# Patient Record
Sex: Female | Born: 1970 | Race: White | Hispanic: No | Smoking: Never smoker
Health system: Southern US, Community
[De-identification: ages and names within clinical notes are randomized; demographics above are authoritative.]

## PROBLEM LIST (undated history)

## (undated) DIAGNOSIS — B009 Herpesviral infection, unspecified: Secondary | ICD-10-CM

## (undated) DIAGNOSIS — N200 Calculus of kidney: Secondary | ICD-10-CM

## (undated) DIAGNOSIS — Z8759 Personal history of other complications of pregnancy, childbirth and the puerperium: Secondary | ICD-10-CM

## (undated) DIAGNOSIS — F329 Major depressive disorder, single episode, unspecified: Secondary | ICD-10-CM

## (undated) DIAGNOSIS — R51 Headache: Secondary | ICD-10-CM

## (undated) DIAGNOSIS — F32A Depression, unspecified: Secondary | ICD-10-CM

## (undated) DIAGNOSIS — F419 Anxiety disorder, unspecified: Secondary | ICD-10-CM

## (undated) DIAGNOSIS — I10 Essential (primary) hypertension: Secondary | ICD-10-CM

## (undated) HISTORY — PX: DILATION AND CURETTAGE OF UTERUS: SHX78

## (undated) HISTORY — PX: INCISION AND DRAINAGE PERIRECTAL ABSCESS: SHX1804

## (undated) HISTORY — PX: TONSILLECTOMY: SUR1361

## (undated) HISTORY — PX: HERNIA REPAIR: SHX51

---

## 2006-11-29 ENCOUNTER — Encounter: Admission: RE | Admit: 2006-11-29 | Discharge: 2006-11-29 | Payer: Self-pay | Admitting: Obstetrics and Gynecology

## 2007-01-07 ENCOUNTER — Encounter: Admission: RE | Admit: 2007-01-07 | Discharge: 2007-01-07 | Payer: Self-pay | Admitting: Specialist

## 2008-01-12 ENCOUNTER — Encounter: Admission: RE | Admit: 2008-01-12 | Discharge: 2008-01-12 | Payer: Self-pay | Admitting: Obstetrics and Gynecology

## 2008-03-16 ENCOUNTER — Encounter (HOSPITAL_COMMUNITY): Payer: Self-pay | Admitting: Obstetrics and Gynecology

## 2008-03-16 ENCOUNTER — Ambulatory Visit (HOSPITAL_COMMUNITY): Admission: RE | Admit: 2008-03-16 | Discharge: 2008-03-16 | Payer: Self-pay | Admitting: Obstetrics and Gynecology

## 2009-02-03 ENCOUNTER — Encounter: Admission: RE | Admit: 2009-02-03 | Discharge: 2009-02-03 | Payer: Self-pay | Admitting: Obstetrics and Gynecology

## 2010-02-13 ENCOUNTER — Encounter: Admission: RE | Admit: 2010-02-13 | Discharge: 2010-02-13 | Payer: Self-pay | Admitting: Obstetrics and Gynecology

## 2010-11-21 NOTE — Op Note (Signed)
NAMERUSSIA, SCHEIDERER                 ACCOUNT NO.:  [id number]   MEDICAL RECORD NO.:  [id number]          PATIENT TYPE:  AMB   LOCATION:  SDC                           FACILITY:  [facility]   PHYSICIAN:  Zelphia Cairo, MD    DATE OF BIRTH:  22-Aug-1970   DATE OF PROCEDURE:  DATE OF DISCHARGE:                               OPERATIVE REPORT   PREOPERATIVE DIAGNOSIS:  Missed abortion.   POSTOPERATIVE DIAGNOSIS:  Missed abortion.   PROCEDURE:  Suction D and E.   SURGEON:  Zelphia Cairo, MD   ANESTHESIA:  General and local.   SPECIMEN:  Products of conception.   DISPOSITION:  Pathology.   ESTIMATED BLOOD LOSS:  300 mL.   COMPLICATIONS:  None.   CONDITION:  Stable to recovery room.   PROCEDURE:  The patient was taken to the operating room where general  anesthesia was found to be adequate.  She was placed in the dorsal  lithotomy position using Allen stirrups.  She was prepped and draped in  sterile fashion, and an in-and-out catheter was used to drain her  bladder for approximately 30 mL of clear urine.  Bivalve speculum was  placed in the vagina, and a single-tooth tenaculum was placed on the  anterior lip of the cervix.  The uterus was serially dilated using Pratt  dilators.  An 8-French suction catheter was inserted into the uterine  cavity and products of conception were removed.  A gentle curetting was  then performed in a clockwise fashion.  There was noted to be some more  tissue within the uterine cavity.  Therefore, the suction curette was  reinserted.  Suction curette was removed.  Another curetting was  performed.  An uterine cry was noted.  Tenaculum was removed from the  cervix.  The cervix was inspected and found to be hemostatic.  Uterus  was firm and hemostatic.  Speculum was removed, and the patient was  taken to the recovery room in stable condition.      Zelphia Cairo, MD  Electronically Signed     GA/MEDQ  D:  03/16/2008  T:  03/16/2008  Job:  [id number]

## 2011-04-11 LAB — CBC
Hemoglobin: 11.4 — ABNORMAL LOW
MCHC: 33.3
MCV: 81
RDW: 14.8
WBC: 5.1

## 2011-05-08 ENCOUNTER — Other Ambulatory Visit: Payer: Self-pay | Admitting: Obstetrics and Gynecology

## 2011-05-08 DIAGNOSIS — Z1231 Encounter for screening mammogram for malignant neoplasm of breast: Secondary | ICD-10-CM

## 2011-05-28 ENCOUNTER — Ambulatory Visit
Admission: RE | Admit: 2011-05-28 | Discharge: 2011-05-28 | Disposition: A | Discharge status: Home / Self Care | Payer: BC Managed Care – PPO | Source: Ambulatory Visit | Attending: Obstetrics and Gynecology | Admitting: Obstetrics and Gynecology

## 2011-05-28 DIAGNOSIS — Z1231 Encounter for screening mammogram for malignant neoplasm of breast: Secondary | ICD-10-CM

## 2012-04-25 ENCOUNTER — Other Ambulatory Visit: Payer: Self-pay | Admitting: Obstetrics and Gynecology

## 2012-04-25 DIAGNOSIS — Z1231 Encounter for screening mammogram for malignant neoplasm of breast: Secondary | ICD-10-CM

## 2012-05-30 ENCOUNTER — Ambulatory Visit: Payer: BC Managed Care – PPO

## 2012-06-25 LAB — OB RESULTS CONSOLE RUBELLA ANTIBODY, IGM: Rubella: IMMUNE

## 2012-06-25 LAB — OB RESULTS CONSOLE ABO/RH

## 2012-06-25 LAB — OB RESULTS CONSOLE GC/CHLAMYDIA
Chlamydia: NEGATIVE
Gonorrhea: NEGATIVE

## 2012-06-25 LAB — OB RESULTS CONSOLE HIV ANTIBODY (ROUTINE TESTING): HIV: NONREACTIVE

## 2012-06-25 LAB — OB RESULTS CONSOLE RPR: RPR: NONREACTIVE

## 2012-06-26 ENCOUNTER — Ambulatory Visit: Payer: BC Managed Care – PPO

## 2012-07-09 NOTE — L&D Delivery Note (Signed)
Delivery Note At 7:15 AM a viable female was delivered via Vaginal, Spontaneous Delivery (Presentation: ; Occiput Anterior).  APGAR: , ; weight .   Placenta status: , .  Cord:  with the following complications: None.  Cord pH: sent  Anesthesia: Epidural  Episiotomy: none Lacerations: sed deg + bilat periurethral lac Suture Repair: 3.0 vicryl rapide Est. Blood Loss (mL): 400  Mom to postpartum.  Baby to nursery-stable.  [name] 01/20/2013, 7:43 AM

## 2012-12-31 LAB — OB RESULTS CONSOLE GBS: GBS: NEGATIVE

## 2013-01-09 ENCOUNTER — Encounter (HOSPITAL_COMMUNITY): Payer: Self-pay | Admitting: *Deleted

## 2013-01-09 ENCOUNTER — Inpatient Hospital Stay (HOSPITAL_COMMUNITY)
Admission: AD | Admit: 2013-01-09 | Discharge: 2013-01-09 | Disposition: A | Discharge status: Home / Self Care | Payer: BC Managed Care – PPO | Source: Ambulatory Visit | Attending: Obstetrics and Gynecology | Admitting: Obstetrics and Gynecology

## 2013-01-09 DIAGNOSIS — O10019 Pre-existing essential hypertension complicating pregnancy, unspecified trimester: Secondary | ICD-10-CM | POA: Insufficient documentation

## 2013-01-09 DIAGNOSIS — D696 Thrombocytopenia, unspecified: Secondary | ICD-10-CM | POA: Insufficient documentation

## 2013-01-09 DIAGNOSIS — O99119 Other diseases of the blood and blood-forming organs and certain disorders involving the immune mechanism complicating pregnancy, unspecified trimester: Secondary | ICD-10-CM | POA: Insufficient documentation

## 2013-01-09 DIAGNOSIS — D689 Coagulation defect, unspecified: Secondary | ICD-10-CM | POA: Insufficient documentation

## 2013-01-09 HISTORY — DX: Anxiety disorder, unspecified: F41.9

## 2013-01-09 HISTORY — DX: Depression, unspecified: F32.A

## 2013-01-09 HISTORY — DX: Essential (primary) hypertension: I10

## 2013-01-09 HISTORY — DX: Headache: R51

## 2013-01-09 HISTORY — DX: Herpesviral infection, unspecified: B00.9

## 2013-01-09 HISTORY — DX: Personal history of other complications of pregnancy, childbirth and the puerperium: Z87.59

## 2013-01-09 HISTORY — DX: Major depressive disorder, single episode, unspecified: F32.9

## 2013-01-09 HISTORY — DX: Calculus of kidney: N20.0

## 2013-01-09 LAB — COMPREHENSIVE METABOLIC PANEL
ALT: 13 U/L (ref 0–35)
AST: 14 U/L (ref 0–37)
Alkaline Phosphatase: 105 U/L (ref 39–117)
BUN: 10 mg/dL (ref 6–23)
CO2: 22 mEq/L (ref 19–32)
Calcium: 8.8 mg/dL (ref 8.4–10.5)
GFR calc non Af Amer: >90 mL/min (ref >90)
Glucose, Bld: 72 mg/dL (ref 70–99)
Sodium: 135 mEq/L (ref 135–145)

## 2013-01-09 LAB — URINALYSIS, ROUTINE W REFLEX MICROSCOPIC
Bilirubin Urine: NEGATIVE
Glucose, UA: NEGATIVE mg/dL
Ketones, ur: NEGATIVE mg/dL
Specific Gravity, Urine: 1.025 (ref 1.005–1.030)

## 2013-01-09 LAB — CBC
MCH: 27.1 pg (ref 26.0–34.0)
RBC: 3.95 MIL/uL (ref 3.87–5.11)
WBC: 6.4 10*3/uL (ref 4.0–10.5)

## 2013-01-09 NOTE — MAU Note (Signed)
Patient has chronic HTN, now being assessed for PIH. States was told Plts were low and was called to come in today for re-check of labs.

## 2013-01-19 ENCOUNTER — Encounter (HOSPITAL_COMMUNITY): Payer: Self-pay | Admitting: *Deleted

## 2013-01-19 ENCOUNTER — Inpatient Hospital Stay (HOSPITAL_COMMUNITY)
Admission: AD | Admit: 2013-01-19 | Discharge: 2013-01-23 | DRG: 372 | Disposition: A | Discharge status: Home / Self Care | Payer: BC Managed Care – PPO | Source: Ambulatory Visit | Attending: Obstetrics and Gynecology | Admitting: Obstetrics and Gynecology

## 2013-01-19 DIAGNOSIS — D689 Coagulation defect, unspecified: Secondary | ICD-10-CM | POA: Diagnosis not present

## 2013-01-19 DIAGNOSIS — IMO0002 Reserved for concepts with insufficient information to code with codable children: Principal | ICD-10-CM | POA: Diagnosis present

## 2013-01-19 DIAGNOSIS — I10 Essential (primary) hypertension: Secondary | ICD-10-CM | POA: Diagnosis present

## 2013-01-19 DIAGNOSIS — O09529 Supervision of elderly multigravida, unspecified trimester: Secondary | ICD-10-CM | POA: Diagnosis present

## 2013-01-19 DIAGNOSIS — D696 Thrombocytopenia, unspecified: Secondary | ICD-10-CM | POA: Diagnosis not present

## 2013-01-19 DIAGNOSIS — D649 Anemia, unspecified: Secondary | ICD-10-CM | POA: Diagnosis not present

## 2013-01-19 DIAGNOSIS — O9903 Anemia complicating the puerperium: Secondary | ICD-10-CM | POA: Diagnosis not present

## 2013-01-19 LAB — CBC
MCV: 82.3 fL (ref 78.0–100.0)
Platelets: 113 10*3/uL — ABNORMAL LOW (ref 150–400)
RDW: 14.8 % (ref 11.5–15.5)
WBC: 6.6 10*3/uL (ref 4.0–10.5)

## 2013-01-19 LAB — COMPREHENSIVE METABOLIC PANEL
Albumin: 2.6 g/dL — ABNORMAL LOW (ref 3.5–5.2)
Alkaline Phosphatase: 121 U/L — ABNORMAL HIGH (ref 39–117)
BUN: 12 mg/dL (ref 6–23)
Calcium: 9 mg/dL (ref 8.4–10.5)
Creatinine, Ser: 0.89 mg/dL (ref 0.50–1.10)
GFR calc Af Amer: >90 mL/min (ref >90)
Glucose, Bld: 83 mg/dL (ref 70–99)
Potassium: 4.4 mEq/L (ref 3.5–5.1)
Total Protein: 5.9 g/dL — ABNORMAL LOW (ref 6.0–8.3)

## 2013-01-19 MED ORDER — ACETAMINOPHEN 325 MG PO TABS: 650.0000 mg | ORAL_TABLET | ORAL | Status: DC | PRN

## 2013-01-19 MED ORDER — TERBUTALINE SULFATE 1 MG/ML IJ SOLN: 0.2500 mg | Freq: Once | INTRAMUSCULAR | Status: AC | PRN

## 2013-01-19 MED ORDER — LIDOCAINE HCL (PF) 1 % IJ SOLN
30.0000 mL | INTRAMUSCULAR | Status: DC | PRN
  Filled 2013-01-19: qty 30

## 2013-01-19 MED ORDER — FLEET ENEMA 7-19 GM/118ML RE ENEM: 1.0000 | ENEMA | RECTAL | Status: DC | PRN

## 2013-01-19 MED ORDER — ZOLPIDEM TARTRATE 5 MG PO TABS: 5.0000 mg | ORAL_TABLET | Freq: Every evening | ORAL | Status: DC | PRN

## 2013-01-19 MED ORDER — MISOPROSTOL 25 MCG QUARTER TABLET
25.0000 ug | ORAL_TABLET | ORAL | Status: DC | PRN
  Administered 2013-01-19 – 2013-01-20 (×2): 25 ug via VAGINAL
  Filled 2013-01-19 (×2): qty 0.25
  Filled 2013-01-19: qty 1

## 2013-01-19 MED ORDER — OXYCODONE-ACETAMINOPHEN 5-325 MG PO TABS: 1.0000 | ORAL_TABLET | ORAL | Status: DC | PRN

## 2013-01-19 MED ORDER — LACTATED RINGERS IV SOLN
INTRAVENOUS | Status: DC
  Administered 2013-01-19 – 2013-01-20 (×2): via INTRAVENOUS

## 2013-01-19 MED ORDER — ONDANSETRON HCL 4 MG/2ML IJ SOLN: 4.0000 mg | Freq: Four times a day (QID) | INTRAMUSCULAR | Status: DC | PRN

## 2013-01-19 MED ORDER — OXYTOCIN 40 UNITS IN LACTATED RINGERS INFUSION - SIMPLE MED
62.5000 mL/h | INTRAVENOUS | Status: DC
  Administered 2013-01-20: 62.5 mL/h via INTRAVENOUS
  Filled 2013-01-19: qty 1000

## 2013-01-19 MED ORDER — OXYTOCIN BOLUS FROM INFUSION: 500.0000 mL | INTRAVENOUS | Status: DC

## 2013-01-19 MED ORDER — IBUPROFEN 600 MG PO TABS
600.0000 mg | ORAL_TABLET | Freq: Four times a day (QID) | ORAL | Status: DC | PRN
  Administered 2013-01-20: 600 mg via ORAL
  Filled 2013-01-19: qty 1

## 2013-01-19 MED ORDER — LACTATED RINGERS IV SOLN: 500.0000 mL | INTRAVENOUS | Status: DC | PRN

## 2013-01-19 MED ORDER — CITRIC ACID-SODIUM CITRATE 334-500 MG/5ML PO SOLN: 30.0000 mL | ORAL | Status: DC | PRN

## 2013-01-19 MED ORDER — METHYLDOPA 500 MG PO TABS
500.0000 mg | ORAL_TABLET | Freq: Once | ORAL | Status: AC
  Administered 2013-01-19: 500 mg via ORAL
  Filled 2013-01-19: qty 1

## 2013-01-19 NOTE — MAU Note (Signed)
Pt reports the doctor told her to come to the hospital at 2100, reports her B/P was elevated in office today. History of high blood pressure.

## 2013-01-19 NOTE — MAU Provider Note (Signed)
  History     CSN: [id number]  Arrival date and time: 01/19/13 2111   None     Chief Complaint  Patient presents with  . Hypertension   HPI [name] is a 42 y.o. [id number] at [redacted]w[redacted]d who was sent to MAU today from the office with elevated BP. The patient has mild headache and peripheral edema. She denies RUQ pain.   OB History   Grav Para Term Preterm Abortions TAB SAB Ect Mult Living   7 1 1  5  5   1       Past Medical History  Diagnosis Date  . Hypertension   . Headache(784.0)   . Herpes   . Anxiety   . Depression   . Kidney stones   . History of miscarriage     X 4    Past Surgical History  Procedure Laterality Date  . Incision and drainage perirectal abscess    . Hernia repair    . Dilation and curettage of uterus      Family History  Problem Relation Age of Onset  . Hypertension Mother   . Hypertension Brother     History  Substance Use Topics  . Smoking status: Never Smoker   . Smokeless tobacco: Never Used  . Alcohol Use: No    Allergies: No Known Allergies  Prescriptions prior to admission  Medication Sig Dispense Refill  . methyldopa (ALDOMET) 250 MG tablet Take 250 mg by mouth 1 day or 1 dose.      . methyldopa (ALDOMET) 500 MG tablet Take 500 mg by mouth 2 (two) times daily.      . pantoprazole (PROTONIX) 20 MG tablet Take 20 mg by mouth daily.      . Prenatal Vit-Fe Fumarate-FA (PRENATAL MULTIVITAMIN) TABS Take 1 tablet by mouth daily at 12 noon.      . valACYclovir (VALTREX) 500 MG tablet Take 500 mg by mouth 2 (two) times daily.        Review of Systems  Gastrointestinal: Negative for abdominal pain.  Musculoskeletal:       + edema   Physical Exam   Blood pressure 177/99, pulse 83, temperature 98.1 F (36.7 C), temperature source Oral, resp. rate 18, height 5\' 6"  (1.676 m), weight 283 lb (128.368 kg), SpO2 99.00%.  Physical Exam  Constitutional: She is oriented to person, place, and time. She appears well-developed and  well-nourished. No distress.  HENT:  Head: Normocephalic and atraumatic.  Cardiovascular: Normal rate, regular rhythm and normal heart sounds.   Respiratory: Effort normal and breath sounds normal. No respiratory distress.  GI: Soft. Bowel sounds are normal. She exhibits no distension and no mass. There is no tenderness. There is no rebound and no guarding.  Musculoskeletal: She exhibits edema (1+ pitting edema in the lower extremities).  Neurological: She is alert and oriented to person, place, and time. She has normal reflexes.  No clonus  Skin: Skin is warm and dry. No erythema.  Psychiatric: She has a normal mood and affect.   MAU Course  Procedures None  MDM Per Dr. Marcelle Overlie medical screening exam only.   Assessment and Plan  Medical screening exam complete Dr. Marcelle Overlie to enter patient's orders. RN will contact Dr. Marcelle Overlie for further management.   Freddi Starr, PA-C  01/19/2013, 10:05 PM

## 2013-01-19 NOTE — H&P (Signed)
Gabriela Stone  DICTATION # [id number] CSN# [id number]   [name], MD 01/19/2013 10:06 PM

## 2013-01-19 NOTE — H&P (Signed)
Gabriela Stone, Gabriela Stone                 ACCOUNT NO.:  [id number]  MEDICAL RECORD NO.:  [id number]  LOCATION:  910B                          FACILITY:  [facility]  PHYSICIAN:  Duke Salvia. Marcelle Overlie, M.D.DATE OF BIRTH:  19-May-1971  DATE OF ADMISSION: DATE OF DISCHARGE:                             HISTORY & PHYSICAL   CHIEF COMPLAINT:  For labor induction, preeclampsia, chronic hypertension.  HISTORY OF PRESENT ILLNESS:  A 42 year old, G7 P1-0-5-1, 38 weeks 1 day. Patient is scheduled for induction later this month, who presented to the office today, already on Aldomet with 1+ protein, BP 178/110 with significant lower extremity edema.  Stat PIH labs were drawn in the office and she would be admitted for two-stage labor induction.  GBS was negative.  Additionally, she has a history of HSV.  She has been on oral Valtrex once daily since 34 weeks.  The procedure for Cytotec followed by Pitocin or AROM labor induction reviewed with her.  Blood type is O positive.  Maternity-21 was done early in pregnancy returned normal.  PAST MEDICAL HISTORY:  Please see the [facility] form for details.  ALLERGIES:  None.  PHYSICAL EXAMINATION:  VITAL SIGNS:  Temp 98.2, blood pressure 178/110. HEENT:  Unremarkable. NECK:  Supple without masses. LUNGS:  Clear. CARDIOVASCULAR:  Regular rate and rhythm without murmurs, rubs, gallops. BREASTS:  Not examined. ABDOMEN:  Term fundal height.  Fetal heart rate 140.  Cervix was fingertip 50% vertex. EXTREMITIES:  Reveal 2 to 3+ pitting lower extremity edema.  Reflexes 1 to 2+.  No clonus.  IMPRESSION: 1. A 38+ week IUP. 2. Chronic hypertension on Aldomet. 3. Superimposed preeclampsia.  PLAN:  Two-stage labor induction.  Procedure and protocol for labor induction reviewed with her which she understands and accepts.     [name], M.D.     [facility]/MEDQ  D:  01/19/2013  T:  01/19/2013  Job:  [id number]

## 2013-01-19 NOTE — MAU Note (Signed)
PT WAS SEEN IN OFFICE - BP ELEVATED   VE 2 CM,  HX- HSV -  LAST OUTBREAK- CAN'T REMEMBER. -  DENIES ANY S/S  FOR OUTBREAK .  TAKING VALTREX

## 2013-01-20 ENCOUNTER — Encounter (HOSPITAL_COMMUNITY): Payer: Self-pay

## 2013-01-20 ENCOUNTER — Inpatient Hospital Stay (HOSPITAL_COMMUNITY): Payer: BC Managed Care – PPO | Admitting: Anesthesiology

## 2013-01-20 ENCOUNTER — Encounter (HOSPITAL_COMMUNITY): Payer: Self-pay | Admitting: Anesthesiology

## 2013-01-20 LAB — CBC
Hemoglobin: 11.5 g/dL — ABNORMAL LOW (ref 12.0–15.0)
MCH: 27.4 pg (ref 26.0–34.0)
MCHC: 33.5 g/dL (ref 30.0–36.0)
Platelets: 115 10*3/uL — ABNORMAL LOW (ref 150–400)
RBC: 4.19 MIL/uL (ref 3.87–5.11)

## 2013-01-20 LAB — TYPE AND SCREEN: Antibody Screen: NEGATIVE

## 2013-01-20 LAB — RPR: RPR Ser Ql: NONREACTIVE

## 2013-01-20 LAB — MRSA PCR SCREENING: MRSA by PCR: NEGATIVE

## 2013-01-20 LAB — ABO/RH: ABO/RH(D): O POS

## 2013-01-20 MED ORDER — PRENATAL MULTIVITAMIN CH
1.0000 | ORAL_TABLET | Freq: Every day | ORAL | Status: DC
  Administered 2013-01-20 – 2013-01-22 (×2): 1 via ORAL
  Filled 2013-01-20 (×2): qty 1

## 2013-01-20 MED ORDER — BUTORPHANOL TARTRATE 1 MG/ML IJ SOLN
1.0000 mg | Freq: Once | INTRAMUSCULAR | Status: DC
  Filled 2013-01-20 (×2): qty 1

## 2013-01-20 MED ORDER — SENNOSIDES-DOCUSATE SODIUM 8.6-50 MG PO TABS
2.0000 | ORAL_TABLET | Freq: Every day | ORAL | Status: DC
  Administered 2013-01-20 – 2013-01-21 (×2): 2 via ORAL

## 2013-01-20 MED ORDER — FLEET ENEMA 7-19 GM/118ML RE ENEM: 1.0000 | ENEMA | Freq: Every day | RECTAL | Status: DC | PRN

## 2013-01-20 MED ORDER — MAGNESIUM SULFATE 40 G IN LACTATED RINGERS - SIMPLE
2.0000 g/h | INTRAVENOUS | Status: DC
  Filled 2013-01-20: qty 500

## 2013-01-20 MED ORDER — MAGNESIUM SULFATE BOLUS VIA INFUSION
4.0000 g | Freq: Once | INTRAVENOUS | Status: AC
  Administered 2013-01-20: 4 g via INTRAVENOUS
  Filled 2013-01-20: qty 500

## 2013-01-20 MED ORDER — FENTANYL 2.5 MCG/ML BUPIVACAINE 1/10 % EPIDURAL INFUSION (WH - ANES)
14.0000 mL/h | INTRAMUSCULAR | Status: DC | PRN
  Administered 2013-01-20: 14 mL/h via EPIDURAL
  Filled 2013-01-20: qty 125

## 2013-01-20 MED ORDER — MEASLES, MUMPS & RUBELLA VAC ~~LOC~~ INJ
0.5000 mL | INJECTION | Freq: Once | SUBCUTANEOUS | Status: DC
  Filled 2013-01-20: qty 0.5

## 2013-01-20 MED ORDER — PROMETHAZINE HCL 25 MG/ML IJ SOLN
12.5000 mg | Freq: Once | INTRAMUSCULAR | Status: DC
  Filled 2013-01-20: qty 1

## 2013-01-20 MED ORDER — EPHEDRINE 5 MG/ML INJ
10.0000 mg | INTRAVENOUS | Status: DC | PRN
  Filled 2013-01-20: qty 4

## 2013-01-20 MED ORDER — LANOLIN HYDROUS EX OINT: TOPICAL_OINTMENT | CUTANEOUS | Status: DC | PRN

## 2013-01-20 MED ORDER — PHENYLEPHRINE 40 MCG/ML (10ML) SYRINGE FOR IV PUSH (FOR BLOOD PRESSURE SUPPORT)
80.0000 ug | PREFILLED_SYRINGE | INTRAVENOUS | Status: DC | PRN
  Filled 2013-01-20: qty 5

## 2013-01-20 MED ORDER — LABETALOL HCL 200 MG PO TABS
200.0000 mg | ORAL_TABLET | Freq: Three times a day (TID) | ORAL | Status: DC
  Administered 2013-01-20 – 2013-01-23 (×8): 200 mg via ORAL
  Filled 2013-01-20 (×9): qty 1

## 2013-01-20 MED ORDER — ZOLPIDEM TARTRATE 5 MG PO TABS: 5.0000 mg | ORAL_TABLET | Freq: Every evening | ORAL | Status: DC | PRN

## 2013-01-20 MED ORDER — LACTATED RINGERS IV SOLN
500.0000 mL | Freq: Once | INTRAVENOUS | Status: AC
  Administered 2013-01-20: 500 mL via INTRAVENOUS

## 2013-01-20 MED ORDER — BENZOCAINE-MENTHOL 20-0.5 % EX AERO
1.0000 "application " | INHALATION_SPRAY | CUTANEOUS | Status: DC | PRN
  Administered 2013-01-20 – 2013-01-21 (×2): 1 via TOPICAL
  Filled 2013-01-20 (×3): qty 56

## 2013-01-20 MED ORDER — DIPHENHYDRAMINE HCL 25 MG PO CAPS: 25.0000 mg | ORAL_CAPSULE | Freq: Four times a day (QID) | ORAL | Status: DC | PRN

## 2013-01-20 MED ORDER — ONDANSETRON HCL 4 MG PO TABS: 4.0000 mg | ORAL_TABLET | ORAL | Status: DC | PRN

## 2013-01-20 MED ORDER — DIBUCAINE 1 % RE OINT
1.0000 "application " | TOPICAL_OINTMENT | RECTAL | Status: DC | PRN
  Filled 2013-01-20: qty 28

## 2013-01-20 MED ORDER — OXYCODONE-ACETAMINOPHEN 5-325 MG PO TABS
1.0000 | ORAL_TABLET | ORAL | Status: DC | PRN
  Administered 2013-01-20: 2 via ORAL
  Administered 2013-01-21: 1 via ORAL
  Administered 2013-01-21: 2 via ORAL
  Filled 2013-01-20 (×2): qty 2
  Filled 2013-01-20: qty 1

## 2013-01-20 MED ORDER — PHENYLEPHRINE 40 MCG/ML (10ML) SYRINGE FOR IV PUSH (FOR BLOOD PRESSURE SUPPORT): 80.0000 ug | PREFILLED_SYRINGE | INTRAVENOUS | Status: DC | PRN

## 2013-01-20 MED ORDER — WITCH HAZEL-GLYCERIN EX PADS: 1.0000 "application " | MEDICATED_PAD | CUTANEOUS | Status: DC | PRN

## 2013-01-20 MED ORDER — OXYCODONE-ACETAMINOPHEN 5-325 MG PO TABS
1.0000 | ORAL_TABLET | Freq: Four times a day (QID) | ORAL | Status: DC | PRN
  Administered 2013-01-20: 1 via ORAL
  Filled 2013-01-20: qty 1

## 2013-01-20 MED ORDER — BISACODYL 10 MG RE SUPP
10.0000 mg | Freq: Every day | RECTAL | Status: DC | PRN
  Filled 2013-01-20: qty 1

## 2013-01-20 MED ORDER — ONDANSETRON HCL 4 MG/2ML IJ SOLN
4.0000 mg | INTRAMUSCULAR | Status: DC | PRN
  Administered 2013-01-20 – 2013-01-21 (×2): 4 mg via INTRAVENOUS
  Filled 2013-01-20 (×2): qty 2

## 2013-01-20 MED ORDER — IBUPROFEN 800 MG PO TABS
800.0000 mg | ORAL_TABLET | Freq: Three times a day (TID) | ORAL | Status: DC | PRN
  Administered 2013-01-20: 800 mg via ORAL
  Filled 2013-01-20: qty 1

## 2013-01-20 MED ORDER — LACTATED RINGERS IV SOLN
INTRAVENOUS | Status: DC
  Administered 2013-01-20 (×2): via INTRAVENOUS

## 2013-01-20 MED ORDER — EPHEDRINE 5 MG/ML INJ: 10.0000 mg | INTRAVENOUS | Status: DC | PRN

## 2013-01-20 MED ORDER — SIMETHICONE 80 MG PO CHEW: 80.0000 mg | CHEWABLE_TABLET | ORAL | Status: DC | PRN

## 2013-01-20 MED ORDER — PANTOPRAZOLE SODIUM 20 MG PO TBEC
20.0000 mg | DELAYED_RELEASE_TABLET | Freq: Every day | ORAL | Status: DC
  Administered 2013-01-20 – 2013-01-23 (×4): 20 mg via ORAL
  Filled 2013-01-20 (×4): qty 1

## 2013-01-20 MED ORDER — TETANUS-DIPHTH-ACELL PERTUSSIS 5-2.5-18.5 LF-MCG/0.5 IM SUSP
0.5000 mL | Freq: Once | INTRAMUSCULAR | Status: DC
  Filled 2013-01-20: qty 0.5

## 2013-01-20 MED ORDER — DIPHENHYDRAMINE HCL 50 MG/ML IJ SOLN: 12.5000 mg | INTRAMUSCULAR | Status: DC | PRN

## 2013-01-20 MED ORDER — SODIUM BICARBONATE 8.4 % IV SOLN
INTRAVENOUS | Status: DC | PRN
  Administered 2013-01-20: 5 mL via EPIDURAL

## 2013-01-20 MED ORDER — METHYLDOPA 500 MG PO TABS
500.0000 mg | ORAL_TABLET | Freq: Two times a day (BID) | ORAL | Status: DC
  Administered 2013-01-20 – 2013-01-23 (×7): 500 mg via ORAL
  Filled 2013-01-20 (×7): qty 1

## 2013-01-20 NOTE — Anesthesia Procedure Notes (Signed)

## 2013-01-20 NOTE — Progress Notes (Signed)
Started MgSO4 @ 0545 when pt noted to be more active, also rec'd epidural, Now AROM>>clear AF, rim/C/-1

## 2013-01-20 NOTE — Anesthesia Preprocedure Evaluation (Signed)
Anesthesia Evaluation  Patient identified by MRN, date of birth, ID band Patient awake    Reviewed: Allergy & Precautions, H&P , Patient's Chart, lab work & pertinent test results  Airway Mallampati: III TM Distance: >3 FB Neck ROM: full    Dental  (+) Teeth Intact   Pulmonary  breath sounds clear to auscultation        Cardiovascular hypertension, Rhythm:regular Rate:Normal     Neuro/Psych    GI/Hepatic   Endo/Other  Morbid obesity  Renal/GU      Musculoskeletal   Abdominal   Peds  Hematology   Anesthesia Other Findings       Reproductive/Obstetrics (+) Pregnancy                           Anesthesia Physical Anesthesia Plan  ASA: III  Anesthesia Plan: Epidural   Post-op Pain Management:    Induction:   Airway Management Planned:   Additional Equipment:   Intra-op Plan:   Post-operative Plan:   Informed Consent: I have reviewed the patients History and Physical, chart, labs and discussed the procedure including the risks, benefits and alternatives for the proposed anesthesia with the patient or authorized representative who has indicated his/her understanding and acceptance.   Dental Advisory Given  Plan Discussed with:   Anesthesia Plan Comments: (Labs checked- platelets confirmed with RN in room. Fetal heart tracing, per RN, reported to be stable enough for sitting procedure. Discussed epidural, and patient consents to the procedure:  included risk of possible headache,backache, failed block, allergic reaction, and nerve injury. This patient was asked if she had any questions or concerns before the procedure started.)        Anesthesia Quick Evaluation  

## 2013-01-21 ENCOUNTER — Encounter (HOSPITAL_COMMUNITY): Payer: Self-pay | Admitting: Anesthesiology

## 2013-01-21 ENCOUNTER — Encounter (HOSPITAL_COMMUNITY)
Admission: AD | Disposition: A | Discharge status: Home / Self Care | Payer: Self-pay | Source: Ambulatory Visit | Attending: Obstetrics and Gynecology

## 2013-01-21 LAB — COMPREHENSIVE METABOLIC PANEL
ALT: 10 U/L (ref 0–35)
AST: 14 U/L (ref 0–37)
Albumin: 2 g/dL — ABNORMAL LOW (ref 3.5–5.2)
CO2: 24 mEq/L (ref 19–32)
Calcium: 7.3 mg/dL — ABNORMAL LOW (ref 8.4–10.5)
GFR calc non Af Amer: 88 mL/min — ABNORMAL LOW (ref >90)
Sodium: 132 mEq/L — ABNORMAL LOW (ref 135–145)
Total Protein: 4.9 g/dL — ABNORMAL LOW (ref 6.0–8.3)

## 2013-01-21 LAB — CBC
HCT: 21.6 % — ABNORMAL LOW (ref 36.0–46.0)
Hemoglobin: 7.4 g/dL — ABNORMAL LOW (ref 12.0–15.0)
MCH: 27.2 pg (ref 26.0–34.0)
MCH: 28 pg (ref 26.0–34.0)
MCHC: 34.3 g/dL (ref 30.0–36.0)
MCV: 81.8 fL (ref 78.0–100.0)
Platelets: 89 10*3/uL — ABNORMAL LOW (ref 150–400)
RBC: 2.61 MIL/uL — ABNORMAL LOW (ref 3.87–5.11)
WBC: 9.1 10*3/uL (ref 4.0–10.5)

## 2013-01-21 SURGERY — LIGATION, FALLOPIAN TUBE, POSTPARTUM
Anesthesia: Epidural | Laterality: Bilateral

## 2013-01-21 MED ORDER — MAGNESIUM SULFATE 40 G IN LACTATED RINGERS - SIMPLE
2.0000 g/h | INTRAVENOUS | Status: DC
  Administered 2013-01-21: 2 g/h via INTRAVENOUS
  Filled 2013-01-21: qty 500

## 2013-01-21 SURGICAL SUPPLY — 21 items
CHLORAPREP W/TINT 26ML (MISCELLANEOUS) ×2 IMPLANT
CLOTH BEACON ORANGE TIMEOUT ST (SAFETY) ×1 IMPLANT
CONTAINER PREFILL 10% NBF 15ML (MISCELLANEOUS) ×2 IMPLANT
GLOVE BIO SURGEON STRL SZ 6.5 (GLOVE) ×1 IMPLANT
GLOVE BIOGEL PI IND STRL 7.0 (GLOVE) ×1 IMPLANT
GLOVE BIOGEL PI INDICATOR 7.0 (GLOVE)
GOWN PREVENTION PLUS LG XLONG (DISPOSABLE) ×2 IMPLANT
NDL HYPO 25X1 1.5 SAFETY (NEEDLE) IMPLANT
NEEDLE HYPO 25X1 1.5 SAFETY (NEEDLE) IMPLANT
NS IRRIG 1000ML POUR BTL (IV SOLUTION) ×1 IMPLANT
PACK ABDOMINAL MINOR (CUSTOM PROCEDURE TRAY) ×1 IMPLANT
SPONGE LAP 4X18 X RAY DECT (DISPOSABLE) IMPLANT
SUT GUT PLAIN 0 CT-3 TAN 27 (SUTURE) IMPLANT
SUT PLAIN 0 NONE (SUTURE) ×1 IMPLANT
SUT VIC AB 0 CT2 27 (SUTURE) ×1 IMPLANT
SUT VIC AB 3-0 PS2 18 (SUTURE)
SUT VIC AB 3-0 PS2 18XBRD (SUTURE) ×1 IMPLANT
SYR CONTROL 10ML LL (SYRINGE) IMPLANT
TOWEL OR 17X24 6PK STRL BLUE (TOWEL DISPOSABLE) ×2 IMPLANT
TRAY FOLEY CATH 14FR (SET/KITS/TRAYS/PACK) ×1 IMPLANT
WATER STERILE IRR 1000ML POUR (IV SOLUTION) ×1 IMPLANT

## 2013-01-21 NOTE — Anesthesia Postprocedure Evaluation (Signed)
Anesthesia Post Note  Patient: Gabriela Stone  Procedure(s) Performed: * No procedures listed *  Anesthesia type: Epidural  Patient location: Mother/Baby  Post pain: Pain level controlled  Post assessment: Post-op Vital signs reviewed  Last Vitals:  Filed Vitals:   01/21/13 1154  BP: 145/92  Pulse: 79  Temp:   Resp: 18    Post vital signs: Reviewed  Level of consciousness:alert  Complications: No apparent anesthesia complications

## 2013-01-21 NOTE — Progress Notes (Signed)
Post Partum Day 1 Subjective: no complaints, up ad lib and tolerating PO.  NPO after MN in hopes of BTL.  Slight HA w/ mag.  No n/v or RUQ pain  Objective: Blood pressure 140/90, pulse 91, temperature 97.9 F (36.6 C), temperature source Oral, resp. rate 18, height 5\' 6"  (1.676 m), weight 119.976 kg (264 lb 8 oz), SpO2 97.00%, unknown if currently breastfeeding. Good UOP Physical Exam:  General: alert and cooperative Lochia: appropriate Uterine Fundus: firm Incision: n/a DVT Evaluation: No evidence of DVT seen on physical exam.   Recent Labs  01/20/13 0530 01/21/13 0520  HGB 11.5* 7.1*  HCT 34.3* 21.3*    Assessment/Plan: Breastfeeding D/c magnesium, foley and transfer to floor if stable Repeat CBC - counseled pt that if thrombocytopenia/anemia persist, will cancel BTL Continue labetalol, methyldopa   LOS: 2 days   Gabriela Stone 01/21/2013, 7:49 AM

## 2013-01-21 NOTE — Progress Notes (Signed)
Results for Gabriela Stone, Gabriela Stone (MRN [id number]) as of 01/21/2013 08:35  Ref. Range 01/21/2013 08:00  WBC Latest Range: 4.0-10.5 K/uL 8.7  RBC Latest Range: 3.87-5.11 MIL/uL 2.64 (L)  Hemoglobin Latest Range: 12.0-15.0 g/dL 7.4 (L)  HCT Latest Range: 36.0-46.0 % 21.6 (L)  MCV Latest Range: 78.0-100.0 fL 81.8  MCH Latest Range: 26.0-34.0 pg 28.0  MCHC Latest Range: 30.0-36.0 g/dL 08.6  RDW Latest Range: 11.5-15.5 % 15.0  Platelets Latest Range: 150-400 K/uL 84 (L)  Dr. Renaldo Fiddler made aware. Orders recieved

## 2013-01-21 NOTE — Progress Notes (Signed)
UR chart review completed.  

## 2013-01-21 NOTE — Progress Notes (Addendum)
Pt made aware of BTL cancellation d/t unchanged lab results-both she and her husband verbalized understanding. Dr. Renaldo Fiddler in earlier on rounds and discussed POC.  Pt c/o worsening headache.  Foley catheter d/c'd and pt wanting to stand up at bedside. Nausea worsened so Zofran given IV as well as a few saltine crackers. Within 10 minutes, pt reported significant relief and is requesting to eat breakfast prior to taking po pain medication.  Orthostatic VS's done, however, pt was quite anxious during this time and may need repeating.  Currently, pt is sitting up in chair, eating breakfast, with c/o "mild" headache.

## 2013-01-21 NOTE — Progress Notes (Signed)
Repeat cbc shows stable anemia and thrombocytopenia.  Will cancel BTL.  Pt aware, regular diet.

## 2013-01-22 LAB — CBC
MCH: 27.3 pg (ref 26.0–34.0)
MCHC: 32.5 g/dL (ref 30.0–36.0)
MCV: 84 fL (ref 78.0–100.0)
Platelets: 92 10*3/uL — ABNORMAL LOW (ref 150–400)
RDW: 15.8 % — ABNORMAL HIGH (ref 11.5–15.5)

## 2013-01-22 MED ORDER — FERROUS SULFATE 325 (65 FE) MG PO TABS
325.0000 mg | ORAL_TABLET | Freq: Once | ORAL | Status: AC
  Administered 2013-01-22: 325 mg via ORAL
  Filled 2013-01-22: qty 1

## 2013-01-22 NOTE — Progress Notes (Signed)
Dr Rana Snare notified pt requesting iron supplement.  Order given   Dr Rana Snare notified of pt's persistence elevated B/P.  No additional orders given

## 2013-01-22 NOTE — Progress Notes (Signed)
CRITICAL VALUE ALERT  Critical value received:  Hgb : 6.5  Date of notification:  01/22/13  Time of notification:  0643  Critical value read back:yes  Nurse who received alert:  Mont Dutton, RN  MD notified (1st page): will notify on rounds  Time of first page:    MD notified (2nd page):  Time of second page:  Responding MD:    Time MD responded:

## 2013-01-22 NOTE — Progress Notes (Signed)
Post Partum Day 3 Subjective: complains of  fatigue and presyncopal epsicodes yesterday  Objective: Blood pressure 150/91, pulse 88, temperature 98.1 F (36.7 Stone), temperature source Oral, resp. rate 18, height 5\' 6"  (1.676 m), weight 119.976 kg (264 lb 8 oz), SpO2 98.00%, unknown if currently breastfeeding.  Physical Exam:  General: alert, cooperative, appears stated age and no distress Lochia: appropriate Uterine Fundus: firm Incision:  DVT Evaluation: No evidence of DVT seen on physical exam.   Recent Labs  01/21/13 0800 01/22/13 0600  HGB 7.4* 6.5*  HCT 21.6* 20.0*    Assessment/Plan: Plan for discharge tomorrow Pt is SP mag and Anemia is 1 gram lower today.  Discussed and offered transfusion today due to sxs and anemia, versus espectant management She desires to monitor today, with increase fluids and FESO4 and recheck CBC in am BPs stable but elevated and Stone met pkg normal yesterday   LOS: 3 days   Gabriela Stone 01/22/2013, 9:29 AM

## 2013-01-22 NOTE — Progress Notes (Signed)
Patient was referred for history of depression/anxiety. * Referral screened out by Clinical Social Worker because none of the following criteria appear to apply:  ~ History of anxiety/depression during this pregnancy, or of post-partum depression.  ~ Diagnosis of anxiety and/or depression within last 3 years, as per pt.  ~ History of depression due to pregnancy loss/loss of child  OR * Patient's symptoms currently being treated with medication and/or therapy.  Please contact the Clinical Social Worker if needs arise, or by the patient's request. Mild anxiety symptoms when pregnancy confirmed.

## 2013-01-23 LAB — COMPREHENSIVE METABOLIC PANEL
ALT: 25 U/L (ref 0–35)
Albumin: 2.3 g/dL — ABNORMAL LOW (ref 3.5–5.2)
Alkaline Phosphatase: 79 U/L (ref 39–117)
Calcium: 8.5 mg/dL (ref 8.4–10.5)
Potassium: 4.2 mEq/L (ref 3.5–5.1)
Sodium: 139 mEq/L (ref 135–145)
Total Protein: 5.4 g/dL — ABNORMAL LOW (ref 6.0–8.3)

## 2013-01-23 LAB — CBC
MCH: 27.5 pg (ref 26.0–34.0)
MCHC: 32.4 g/dL (ref 30.0–36.0)
RDW: 15.8 % — ABNORMAL HIGH (ref 11.5–15.5)

## 2013-01-23 MED ORDER — BENZOCAINE-MENTHOL 20-0.5 % EX AERO: 1.0000 "application " | INHALATION_SPRAY | CUTANEOUS | Status: DC | PRN

## 2013-01-23 MED ORDER — OXYCODONE-ACETAMINOPHEN 5-325 MG PO TABS: 1.0000 | ORAL_TABLET | Freq: Four times a day (QID) | ORAL | Status: DC | PRN

## 2013-01-23 MED ORDER — LABETALOL HCL 200 MG PO TABS: 200.0000 mg | ORAL_TABLET | Freq: Three times a day (TID) | ORAL | Status: DC

## 2013-01-23 MED ORDER — CYCLOBENZAPRINE HCL 10 MG PO TABS: 10.0000 mg | ORAL_TABLET | Freq: Three times a day (TID) | ORAL | Status: DC | PRN

## 2013-01-23 NOTE — Lactation Note (Signed)
This note was copied from the chart of Gabriela Stone. Lactation Consultation Note  Patient Name: Gabriela Stone [id number] Date: 01/23/2013 Reason for consult: Follow-up assessment   Maternal Data    Feeding    LATCH Score/Interventions             Problem noted: Filling;Mild/Moderate discomfort Interventions (Filling): Frequent nursing Interventions  (Cracked/bleeding/bruising/blister): Expressed breast milk to nipple Interventions (Mild/moderate discomfort): Hand expression        Lactation Tools Discussed/Used     Consult Status Consult Status: Complete  Experienced BF mom reports that baby is nursing well but her breasts are very full. She feels she is making more than the baby needs. Baby has just nursed and breasts do feel softer. She reports that her nipples feel better than yesterday. Reviewed engorgement prevention and treatment. No questions at present. Has Medela DEBP at home. To call prn  Pamelia Hoit 01/23/2013, 9:07 AM

## 2013-01-23 NOTE — Discharge Summary (Signed)
Obstetric Discharge Summary Reason for Admission: induction of labor Prenatal Procedures: NST, Preeclampsia and ultrasound Intrapartum Procedures: spontaneous vaginal delivery Postpartum Procedures: none Complications-Operative and Postpartum: none Hemoglobin  Date Value Range Status  01/23/2013 7.0* 12.0 - 15.0 g/dL Final     HCT  Date Value Range Status  01/23/2013 21.6* 36.0 - 46.0 % Final    Physical Exam:  General: alert, cooperative and no distress Lochia: appropriate Uterine Fundus: firm Incision: healing well DVT Evaluation: No evidence of DVT seen on physical exam.  Discharge Diagnoses: Term Pregnancy-delivered and Preelampsia  Discharge Information: Date: 01/23/2013 Activity: pelvic rest Diet: routine Medications: Percocet and flexeril Condition: improved Instructions: refer to practice specific booklet Discharge to: home   Newborn Data: Live born female  Birth Weight: 6 lb 14.9 oz (3144 g) APGAR: 9, 9  Home with mother.  Gabriela Stone,Gabriela Stone 01/23/2013, 9:15 AM

## 2013-01-23 NOTE — Progress Notes (Signed)
Feeling better Decreasing lochia, voiding, ambulating  Filed Vitals:   01/23/13 0605  BP: 149/91  Pulse: 88  Temp: 98.1 F (36.7 C)  Resp: 18   FFNT  Results for orders placed during the hospital encounter of 01/19/13 (from the past 24 hour(s))  CBC     Status: Abnormal   Collection Time    01/23/13  5:50 AM      Result Value Range   WBC 6.9  4.0 - 10.5 K/uL   RBC 2.55 (*) 3.87 - 5.11 MIL/uL   Hemoglobin 7.0 (*) 12.0 - 15.0 g/dL   HCT 16.1 (*) 09.6 - 04.5 %   MCV 84.7  78.0 - 100.0 fL   MCH 27.5  26.0 - 34.0 pg   MCHC 32.4  30.0 - 36.0 g/dL   RDW 40.9 (*) 81.1 - 91.4 %   Platelets 96 (*) 150 - 400 K/uL  COMPREHENSIVE METABOLIC PANEL     Status: Abnormal   Collection Time    01/23/13  5:50 AM      Result Value Range   Sodium 139  135 - 145 mEq/L   Potassium 4.2  3.5 - 5.1 mEq/L   Chloride 105  96 - 112 mEq/L   CO2 25  19 - 32 mEq/L   Glucose, Bld 81  70 - 99 mg/dL   BUN 9  6 - 23 mg/dL   Creatinine, Ser 7.82  0.50 - 1.10 mg/dL   Calcium 8.5  8.4 - 95.6 mg/dL   Total Protein 5.4 (*) 6.0 - 8.3 g/dL   Albumin 2.3 (*) 3.5 - 5.2 g/dL   AST 34  0 - 37 U/L   ALT 25  0 - 35 U/L   Alkaline Phosphatase 79  39 - 117 U/L   Total Bilirubin 0.1 (*) 0.3 - 1.2 mg/dL   GFR calc non Af Amer >90  >90 mL/min   GFR calc Af Amer >90  >90 mL/min   A: Satisfactory  P: D/C home     Instructions given, no NSAIDS     FU office next week

## 2013-01-26 ENCOUNTER — Inpatient Hospital Stay (HOSPITAL_COMMUNITY)
Admission: AD | Admit: 2013-01-26 | Discharge: 2013-01-28 | DRG: 376 | Disposition: A | Discharge status: Home / Self Care | Payer: BC Managed Care – PPO | Source: Ambulatory Visit | Attending: Obstetrics and Gynecology | Admitting: Obstetrics and Gynecology

## 2013-01-26 ENCOUNTER — Encounter (HOSPITAL_COMMUNITY): Payer: Self-pay | Admitting: *Deleted

## 2013-01-26 DIAGNOSIS — IMO0002 Reserved for concepts with insufficient information to code with codable children: Principal | ICD-10-CM | POA: Diagnosis present

## 2013-01-26 DIAGNOSIS — I10 Essential (primary) hypertension: Secondary | ICD-10-CM | POA: Diagnosis present

## 2013-01-26 LAB — CBC
MCH: 27.6 pg (ref 26.0–34.0)
MCV: 83.9 fL (ref 78.0–100.0)
Platelets: 154 10*3/uL (ref 150–400)
RDW: 15.3 % (ref 11.5–15.5)
WBC: 5.6 10*3/uL (ref 4.0–10.5)

## 2013-01-26 LAB — COMPREHENSIVE METABOLIC PANEL
AST: 31 U/L (ref 0–37)
Albumin: 2.8 g/dL — ABNORMAL LOW (ref 3.5–5.2)
Calcium: 8.7 mg/dL (ref 8.4–10.5)
Chloride: 104 mEq/L (ref 96–112)
Creatinine, Ser: 0.67 mg/dL (ref 0.50–1.10)

## 2013-01-26 LAB — URINE MICROSCOPIC-ADD ON

## 2013-01-26 LAB — URINALYSIS, ROUTINE W REFLEX MICROSCOPIC
Bilirubin Urine: NEGATIVE
Glucose, UA: NEGATIVE mg/dL
Ketones, ur: 15 mg/dL — AB
Protein, ur: NEGATIVE mg/dL
Specific Gravity, Urine: >1.03 — ABNORMAL HIGH (ref 1.005–1.030)
Urobilinogen, UA: 1 mg/dL (ref 0.0–1.0)
pH: 6.5 (ref 5.0–8.0)

## 2013-01-26 LAB — LACTATE DEHYDROGENASE: LDH: 267 U/L — ABNORMAL HIGH (ref 94–250)

## 2013-01-26 MED ORDER — MAGNESIUM SULFATE 40 MG/ML IJ SOLN: 4.0000 g | Freq: Once | INTRAMUSCULAR | Status: DC

## 2013-01-26 MED ORDER — PRENATAL MULTIVITAMIN CH
1.0000 | ORAL_TABLET | Freq: Every day | ORAL | Status: DC
  Administered 2013-01-27: 1 via ORAL
  Filled 2013-01-26: qty 1

## 2013-01-26 MED ORDER — MAGNESIUM SULFATE 40 G IN LACTATED RINGERS - SIMPLE
2.0000 g/h | INTRAVENOUS | Status: DC
  Administered 2013-01-26: 2 g/h via INTRAVENOUS
  Filled 2013-01-26: qty 500

## 2013-01-26 MED ORDER — MAGNESIUM SULFATE 40 G IN LACTATED RINGERS - SIMPLE: 2.0000 g/h | INTRAVENOUS | Status: DC

## 2013-01-26 MED ORDER — OXYCODONE-ACETAMINOPHEN 5-325 MG PO TABS
2.0000 | ORAL_TABLET | ORAL | Status: DC | PRN
  Administered 2013-01-27: 1 via ORAL
  Filled 2013-01-26: qty 1

## 2013-01-26 MED ORDER — METHYLDOPA 500 MG PO TABS
500.0000 mg | ORAL_TABLET | Freq: Two times a day (BID) | ORAL | Status: DC
  Administered 2013-01-26 – 2013-01-27 (×3): 500 mg via ORAL
  Filled 2013-01-26 (×4): qty 1

## 2013-01-26 MED ORDER — LACTATED RINGERS IV SOLN
INTRAVENOUS | Status: DC
  Administered 2013-01-26: 19:00:00 via INTRAVENOUS

## 2013-01-26 MED ORDER — DOCUSATE SODIUM 100 MG PO CAPS
100.0000 mg | ORAL_CAPSULE | Freq: Every day | ORAL | Status: DC
  Administered 2013-01-26 – 2013-01-27 (×2): 100 mg via ORAL
  Filled 2013-01-26 (×2): qty 1

## 2013-01-26 MED ORDER — CALCIUM CARBONATE ANTACID 500 MG PO CHEW: 2.0000 | CHEWABLE_TABLET | ORAL | Status: DC | PRN

## 2013-01-26 MED ORDER — MAGNESIUM SULFATE BOLUS VIA INFUSION
4.0000 g | Freq: Once | INTRAVENOUS | Status: AC
  Administered 2013-01-26: 4 g via INTRAVENOUS
  Filled 2013-01-26: qty 500

## 2013-01-26 MED ORDER — LABETALOL HCL 200 MG PO TABS
200.0000 mg | ORAL_TABLET | Freq: Three times a day (TID) | ORAL | Status: DC
  Administered 2013-01-26 – 2013-01-27 (×2): 200 mg via ORAL
  Filled 2013-01-26 (×2): qty 1

## 2013-01-26 MED ORDER — ACETAMINOPHEN 325 MG PO TABS
650.0000 mg | ORAL_TABLET | ORAL | Status: DC | PRN
  Administered 2013-01-26 – 2013-01-27 (×3): 650 mg via ORAL
  Filled 2013-01-26 (×3): qty 2

## 2013-01-26 MED ORDER — ZOLPIDEM TARTRATE 5 MG PO TABS: 5.0000 mg | ORAL_TABLET | Freq: Every evening | ORAL | Status: DC | PRN

## 2013-01-26 NOTE — MAU Note (Addendum)
Vaginal delivery 7/15. Was on Magnesium during labor and postpartum. Sent home on Labetalol and aldomet. Was seen at office and BP was very high (174/102). Was referred to MAU for further evaluation. Patient C/O HA. Occas blurred vision. No other sxs.

## 2013-01-26 NOTE — H&P (Signed)
Gabriela Stone is an 42 y.o. female. Vaginal delivery on 7/15.  Prenatal course complicated by chronic hypertension.  Presently on aldomet 500 mg BID and labetalol 200 mg TID.  Blood pressure in office today  174/102.  Sent to MAU where LFT"s were elevated. Admitted for magnesium sulfate.    Pertinent Gynecological History: Menses: postpartum Bleeding: na Contraception: na DES exposure: denies Blood transfusions: none Sexually transmitted diseases: no past history Previous GYN Procedures: DNC  Last mammogram: na Date: na Last pap: normal Date: 2013 OB History: G7, P2   Menstrual History: Menarche age: 58  No LMP recorded.    Past Medical History  Diagnosis Date  . Hypertension   . Headache(784.0)   . Herpes   . Anxiety   . Depression   . Kidney stones   . History of miscarriage     X 4    Past Surgical History  Procedure Laterality Date  . Incision and drainage perirectal abscess    . Hernia repair    . Dilation and curettage of uterus    . Tonsillectomy      Family History  Problem Relation Age of Onset  . Hypertension Mother   . Hypertension Brother     Social History:  reports that she has never smoked. She has never used smokeless tobacco. She reports that she does not drink alcohol or use illicit drugs.  Allergies: No Known Allergies  Prescriptions prior to admission  Medication Sig Dispense Refill  . benzocaine-Menthol (DERMOPLAST) 20-0.5 % AERO Apply 1 application topically as needed (perineal discomfort).  56 g  2  . cyclobenzaprine (FLEXERIL) 10 MG tablet Take 1 tablet (10 mg total) by mouth 3 (three) times daily as needed for muscle spasms.  30 tablet  0  . docusate sodium (COLACE) 100 MG capsule Take 100 mg by mouth daily as needed for constipation.      Marland Kitchen labetalol (NORMODYNE) 200 MG tablet Take 1 tablet (200 mg total) by mouth 3 (three) times daily.  90 tablet  2  . methyldopa (ALDOMET) 250 MG tablet Take 250 mg by mouth 1 day or 1 dose. Takes with  lunch.      . methyldopa (ALDOMET) 500 MG tablet Take 500 mg by mouth 2 (two) times daily. Takes with breakfast and at bedtime.      Marland Kitchen oxyCODONE-acetaminophen (PERCOCET/ROXICET) 5-325 MG per tablet Take 1-2 tablets by mouth every 6 (six) hours as needed.  30 tablet  0  . Prenatal Vit-Fe Fumarate-FA (PRENATAL MULTIVITAMIN) TABS Take 1 tablet by mouth at bedtime.        Review of Systems  Neurological: Positive for headaches.    Blood pressure 167/98, pulse 88, temperature 98 F (36.7 C), temperature source Oral, resp. rate 18, height 5\' 6"  (1.676 m), weight 116.574 kg (257 lb), unknown if currently breastfeeding. Physical Exam  Constitutional: She is oriented to person, place, and time.  Cardiovascular: Normal rate, regular rhythm and normal heart sounds.   Respiratory: Effort normal and breath sounds normal.  GI: Soft.  Uterus below umbilicus   Musculoskeletal: She exhibits edema.  Neurological: She is alert and oriented to person, place, and time. She has normal reflexes.    Results for orders placed during the hospital encounter of 01/26/13 (from the past 24 hour(s))  URINALYSIS, ROUTINE W REFLEX MICROSCOPIC     Status: Abnormal   Collection Time    01/26/13  4:55 PM      Result Value Range   Color,  Urine YELLOW  YELLOW   APPearance HAZY (*) CLEAR   Specific Gravity, Urine >1.030 (*) 1.005 - 1.030   pH 6.5  5.0 - 8.0   Glucose, UA NEGATIVE  NEGATIVE mg/dL   Hgb urine dipstick LARGE (*) NEGATIVE   Bilirubin Urine NEGATIVE  NEGATIVE   Ketones, ur 15 (*) NEGATIVE mg/dL   Protein, ur NEGATIVE  NEGATIVE mg/dL   Urobilinogen, UA 1.0  0.0 - 1.0 mg/dL   Nitrite NEGATIVE  NEGATIVE   Leukocytes, UA SMALL (*) NEGATIVE  URINE MICROSCOPIC-ADD ON     Status: Abnormal   Collection Time    01/26/13  4:55 PM      Result Value Range   Squamous Epithelial / LPF FEW (*) RARE   WBC, UA 21-50  <3 WBC/hpf   RBC / HPF 21-50  <3 RBC/hpf  CBC     Status: Abnormal   Collection Time     01/26/13  5:02 PM      Result Value Range   WBC 5.6  4.0 - 10.5 K/uL   RBC 2.86 (*) 3.87 - 5.11 MIL/uL   Hemoglobin 7.9 (*) 12.0 - 15.0 g/dL   HCT 16.1 (*) 09.6 - 04.5 %   MCV 83.9  78.0 - 100.0 fL   MCH 27.6  26.0 - 34.0 pg   MCHC 32.9  30.0 - 36.0 g/dL   RDW 40.9  81.1 - 91.4 %   Platelets 154  150 - 400 K/uL  COMPREHENSIVE METABOLIC PANEL     Status: Abnormal   Collection Time    01/26/13  5:02 PM      Result Value Range   Sodium 140  135 - 145 mEq/L   Potassium 3.6  3.5 - 5.1 mEq/L   Chloride 104  96 - 112 mEq/L   CO2 24  19 - 32 mEq/L   Glucose, Bld 92  70 - 99 mg/dL   BUN 14  6 - 23 mg/dL   Creatinine, Ser 7.82  0.50 - 1.10 mg/dL   Calcium 8.7  8.4 - 95.6 mg/dL   Total Protein 5.9 (*) 6.0 - 8.3 g/dL   Albumin 2.8 (*) 3.5 - 5.2 g/dL   AST 31  0 - 37 U/L   ALT 47 (*) 0 - 35 U/L   Alkaline Phosphatase 91  39 - 117 U/L   Total Bilirubin 0.2 (*) 0.3 - 1.2 mg/dL   GFR calc non Af Amer >90  >90 mL/min   GFR calc Af Amer >90  >90 mL/min  URIC ACID     Status: None   Collection Time    01/26/13  5:02 PM      Result Value Range   Uric Acid, Serum 5.7  2.4 - 7.0 mg/dL  LACTATE DEHYDROGENASE     Status: Abnormal   Collection Time    01/26/13  5:02 PM      Result Value Range   LDH 267 (*) 94 - 250 U/L    No results found.  Assessment/Plan: Postpartum pre-eclampsia Chronic hypertension Admit for magnesium sulfate labs in am.  Gabriela Stone S 01/26/2013, 6:03 PM

## 2013-01-26 NOTE — MAU Provider Note (Signed)
Chief Complaint: Hypertension   First Provider Initiated Contact with Patient 01/26/13 1715     SUBJECTIVE HPI: Gabriela Stone is a 42 y.o. [id number] who presents to maternity admissions  7 days post NSVD following IOL for [facility] with superimposed preeclmapisa, reporting severe headache since yesterday and elevated BP today.  She had elevated BP at her daughter's pediatrician visit this morning, then came home and took her BP medication and a Percocet.  One hour later her BP remained 160s over 100s and her headache was still severe.  She went to the office first and was sent over to MAU.  She reports normal lochia, denies visual disturbances, epigastric pain, vaginal itching/burning, urinary symptoms, dizziness, n/v, or fever/chills.    Past Medical History  Diagnosis Date  . Hypertension   . Headache(784.0)   . Herpes   . Anxiety   . Depression   . Kidney stones   . History of miscarriage     X 4   Past Surgical History  Procedure Laterality Date  . Incision and drainage perirectal abscess    . Hernia repair    . Dilation and curettage of uterus    . Tonsillectomy     History   Social History  . Marital Status: Single    Spouse Name: N/A    Number of Children: N/A  . Years of Education: N/A   Occupational History  . Not on file.   Social History Main Topics  . Smoking status: Never Smoker   . Smokeless tobacco: Never Used  . Alcohol Use: No  . Drug Use: No  . Sexually Active: Not on file   Other Topics Concern  . Not on file   Social History Narrative  . No narrative on file   No current facility-administered medications on file prior to encounter.   Current Outpatient Prescriptions on File Prior to Encounter  Medication Sig Dispense Refill  . benzocaine-Menthol (DERMOPLAST) 20-0.5 % AERO Apply 1 application topically as needed (perineal discomfort).  56 g  2  . cyclobenzaprine (FLEXERIL) 10 MG tablet Take 1 tablet (10 mg total) by mouth 3 (three) times daily as  needed for muscle spasms.  30 tablet  0  . labetalol (NORMODYNE) 200 MG tablet Take 1 tablet (200 mg total) by mouth 3 (three) times daily.  90 tablet  2  . methyldopa (ALDOMET) 250 MG tablet Take 250 mg by mouth 1 day or 1 dose. Takes with lunch.      . methyldopa (ALDOMET) 500 MG tablet Take 500 mg by mouth 2 (two) times daily. Takes with breakfast and at bedtime.      Marland Kitchen oxyCODONE-acetaminophen (PERCOCET/ROXICET) 5-325 MG per tablet Take 1-2 tablets by mouth every 6 (six) hours as needed.  30 tablet  0   No Known Allergies  ROS: Pertinent items in HPI  OBJECTIVE Blood pressure 171/99, pulse 84, temperature 98 F (36.7 C), temperature source Oral, resp. rate 18, height 5\' 6"  (1.676 m), weight 116.574 kg (257 lb), unknown if currently breastfeeding. GENERAL: Well-developed, well-nourished female in no acute distress.  HEENT: Normocephalic HEART: normal rate RESP: normal effort ABDOMEN: Soft, non-tender EXTREMITIES: Nontender, no edema NEURO: Alert and oriented  LAB RESULTS Results for orders placed during the hospital encounter of 01/26/13 (from the past 24 hour(s))  URINALYSIS, ROUTINE W REFLEX MICROSCOPIC     Status: Abnormal   Collection Time    01/26/13  4:55 PM      Result Value Range   Color,  Urine YELLOW  YELLOW   APPearance HAZY (*) CLEAR   Specific Gravity, Urine >1.030 (*) 1.005 - 1.030   pH 6.5  5.0 - 8.0   Glucose, UA NEGATIVE  NEGATIVE mg/dL   Hgb urine dipstick LARGE (*) NEGATIVE   Bilirubin Urine NEGATIVE  NEGATIVE   Ketones, ur 15 (*) NEGATIVE mg/dL   Protein, ur NEGATIVE  NEGATIVE mg/dL   Urobilinogen, UA 1.0  0.0 - 1.0 mg/dL   Nitrite NEGATIVE  NEGATIVE   Leukocytes, UA SMALL (*) NEGATIVE  URINE MICROSCOPIC-ADD ON     Status: Abnormal   Collection Time    01/26/13  4:55 PM      Result Value Range   Squamous Epithelial / LPF FEW (*) RARE   WBC, UA 21-50  <3 WBC/hpf   RBC / HPF 21-50  <3 RBC/hpf  CBC     Status: Abnormal   Collection Time    01/26/13   5:02 PM      Result Value Range   WBC 5.6  4.0 - 10.5 K/uL   RBC 2.86 (*) 3.87 - 5.11 MIL/uL   Hemoglobin 7.9 (*) 12.0 - 15.0 g/dL   HCT 95.6 (*) 21.3 - 08.6 %   MCV 83.9  78.0 - 100.0 fL   MCH 27.6  26.0 - 34.0 pg   MCHC 32.9  30.0 - 36.0 g/dL   RDW 57.8  46.9 - 62.9 %   Platelets 154  150 - 400 K/uL  COMPREHENSIVE METABOLIC PANEL     Status: Abnormal   Collection Time    01/26/13  5:02 PM      Result Value Range   Sodium 140  135 - 145 mEq/L   Potassium 3.6  3.5 - 5.1 mEq/L   Chloride 104  96 - 112 mEq/L   CO2 24  19 - 32 mEq/L   Glucose, Bld 92  70 - 99 mg/dL   BUN 14  6 - 23 mg/dL   Creatinine, Ser 5.28  0.50 - 1.10 mg/dL   Calcium 8.7  8.4 - 41.3 mg/dL   Total Protein 5.9 (*) 6.0 - 8.3 g/dL   Albumin 2.8 (*) 3.5 - 5.2 g/dL   AST 31  0 - 37 U/L   ALT 47 (*) 0 - 35 U/L   Alkaline Phosphatase 91  39 - 117 U/L   Total Bilirubin 0.2 (*) 0.3 - 1.2 mg/dL   GFR calc non Af Amer >90  >90 mL/min   GFR calc Af Amer >90  >90 mL/min  URIC ACID     Status: None   Collection Time    01/26/13  5:02 PM      Result Value Range   Uric Acid, Serum 5.7  2.4 - 7.0 mg/dL  LACTATE DEHYDROGENASE     Status: Abnormal   Collection Time    01/26/13  5:02 PM      Result Value Range   LDH 267 (*) 94 - 250 U/L    ASSESSMENT 1. Hypertension in pregnancy, preeclampsia, postpartum condition     PLAN Called Dr Arelia Sneddon to discuss assessment and findings Plan to admit for magnesium sulfate for preeclampsia Dr Arelia Sneddon to assume care of pt    Medication List    ASK your doctor about these medications       benzocaine-Menthol 20-0.5 % Aero  Commonly known as:  DERMOPLAST  Apply 1 application topically as needed (perineal discomfort).     cyclobenzaprine 10 MG tablet  Commonly known  as:  FLEXERIL  Take 1 tablet (10 mg total) by mouth 3 (three) times daily as needed for muscle spasms.     docusate sodium 100 MG capsule  Commonly known as:  COLACE  Take 100 mg by mouth daily as needed for  constipation.     labetalol 200 MG tablet  Commonly known as:  NORMODYNE  Take 1 tablet (200 mg total) by mouth 3 (three) times daily.     methyldopa 500 MG tablet  Commonly known as:  ALDOMET  Take 500 mg by mouth 2 (two) times daily. Takes with breakfast and at bedtime.     methyldopa 250 MG tablet  Commonly known as:  ALDOMET  Take 250 mg by mouth 1 day or 1 dose. Takes with lunch.     oxyCODONE-acetaminophen 5-325 MG per tablet  Commonly known as:  PERCOCET/ROXICET  Take 1-2 tablets by mouth every 6 (six) hours as needed.     prenatal multivitamin Tabs  Take 1 tablet by mouth at bedtime.         Sharen Counter Certified Nurse-Midwife 01/26/2013  5:33 PM

## 2013-01-27 ENCOUNTER — Inpatient Hospital Stay (HOSPITAL_COMMUNITY): Admission: RE | Admit: 2013-01-27 | Payer: BC Managed Care – PPO | Source: Ambulatory Visit

## 2013-01-27 LAB — COMPREHENSIVE METABOLIC PANEL
AST: 25 U/L (ref 0–37)
Albumin: 2.7 g/dL — ABNORMAL LOW (ref 3.5–5.2)
Alkaline Phosphatase: 89 U/L (ref 39–117)
BUN: 12 mg/dL (ref 6–23)
CO2: 26 mEq/L (ref 19–32)
Chloride: 104 mEq/L (ref 96–112)
GFR calc non Af Amer: >90 mL/min (ref >90)
Potassium: 4.3 mEq/L (ref 3.5–5.1)
Total Bilirubin: 0.2 mg/dL — ABNORMAL LOW (ref 0.3–1.2)

## 2013-01-27 LAB — CBC
MCH: 27.1 pg (ref 26.0–34.0)
MCV: 84.6 fL (ref 78.0–100.0)
Platelets: 158 10*3/uL (ref 150–400)
RDW: 15.4 % (ref 11.5–15.5)
WBC: 5.6 10*3/uL (ref 4.0–10.5)

## 2013-01-27 MED ORDER — LABETALOL HCL 300 MG PO TABS
300.0000 mg | ORAL_TABLET | Freq: Three times a day (TID) | ORAL | Status: DC
  Administered 2013-01-27: 300 mg via ORAL
  Filled 2013-01-27 (×2): qty 1

## 2013-01-27 MED ORDER — FERROUS SULFATE 325 (65 FE) MG PO TABS
325.0000 mg | ORAL_TABLET | Freq: Two times a day (BID) | ORAL | Status: DC
  Administered 2013-01-27: 325 mg via ORAL
  Filled 2013-01-27: qty 1

## 2013-01-27 MED ORDER — CYCLOBENZAPRINE HCL 5 MG PO TABS
5.0000 mg | ORAL_TABLET | Freq: Once | ORAL | Status: AC
  Administered 2013-01-27: 5 mg via ORAL
  Filled 2013-01-27: qty 1

## 2013-01-27 MED ORDER — LABETALOL HCL 300 MG PO TABS
300.0000 mg | ORAL_TABLET | Freq: Three times a day (TID) | ORAL | Status: DC
  Administered 2013-01-27: 300 mg via ORAL
  Filled 2013-01-27 (×3): qty 1

## 2013-01-27 MED ORDER — SODIUM CHLORIDE 0.9 % IJ SOLN
3.0000 mL | Freq: Two times a day (BID) | INTRAMUSCULAR | Status: DC
  Administered 2013-01-27 (×2): 3 mL via INTRAVENOUS

## 2013-01-27 MED ORDER — LABETALOL HCL 200 MG PO TABS
200.0000 mg | ORAL_TABLET | Freq: Once | ORAL | Status: AC
  Administered 2013-01-27: 200 mg via ORAL
  Filled 2013-01-27: qty 1

## 2013-01-27 NOTE — Progress Notes (Signed)
Pt c/o HA a/w magnesium this am.  No n/v or RUQ pain.  HA had resolved yesterday until magnesium started, then HA returned.  HA controlled w/ PO tylenol  BP 140-150/90/101 VSS Gen - NAD Abd - soft, NT/ND Ext - mild edema bilaterally  Labs reviewed - LFTs improved  A/P:  CHTN w/ postpartum Pre-e D/c magnesium Watch BP closely and adjust meds prn (aldomet/labetalol) Breastfeeding Repeat labs in am

## 2013-01-27 NOTE — Progress Notes (Signed)
UR chart review completed.  

## 2013-01-27 NOTE — Progress Notes (Signed)
Pt transferred ambulatory to [id number]

## 2013-01-28 LAB — COMPREHENSIVE METABOLIC PANEL
ALT: 32 U/L (ref 0–35)
AST: 18 U/L (ref 0–37)
Albumin: 2.7 g/dL — ABNORMAL LOW (ref 3.5–5.2)
Alkaline Phosphatase: 85 U/L (ref 39–117)
Potassium: 3.9 mEq/L (ref 3.5–5.1)
Sodium: 140 mEq/L (ref 135–145)
Total Protein: 6 g/dL (ref 6.0–8.3)

## 2013-01-28 LAB — CBC
MCHC: 32.4 g/dL (ref 30.0–36.0)
RDW: 15.3 % (ref 11.5–15.5)
WBC: 5.9 10*3/uL (ref 4.0–10.5)

## 2013-01-28 MED ORDER — FERROUS SULFATE 325 (65 FE) MG PO TABS: 325.0000 mg | ORAL_TABLET | Freq: Two times a day (BID) | ORAL | Status: DC

## 2013-01-28 MED ORDER — LABETALOL HCL 300 MG PO TABS: 300.0000 mg | ORAL_TABLET | Freq: Three times a day (TID) | ORAL | Status: DC

## 2013-01-28 MED ORDER — METHYLDOPA 500 MG PO TABS: 500.0000 mg | ORAL_TABLET | Freq: Two times a day (BID) | ORAL | Status: DC

## 2013-01-28 NOTE — Progress Notes (Cosign Needed)
S: No complaint of HA, blurred vision or RUQ pain. Feels great. Ready for discharge. O: Temp:  [97.9 F (36.6 C)-99 F (37.2 C)] 98.4 F (36.9 C) (07/23 0545) Pulse Rate:  [71-94] 77 (07/23 0545) Resp:  [16-20] 18 (07/23 0545) BP: (136-166)/(82-104) 136/84 mmHg (07/23 0545) SpO2:  [95 %-99 %] 96 % (07/23 0545) ABd soft , no RUQ tenderness  FF scant lochia DTR's 1+ slight pedal edema, no clonus , no evidence of DVT. Negative homans' sign   Results for orders placed during the hospital encounter of 01/26/13 (from the past 24 hour(s))  CBC     Status: Abnormal   Collection Time    01/28/13  5:20 AM      Result Value Range   WBC 5.9  4.0 - 10.5 K/uL   RBC 2.85 (*) 3.87 - 5.11 MIL/uL   Hemoglobin 7.8 (*) 12.0 - 15.0 g/dL   HCT 62.1 (*) 30.8 - 65.7 %   MCV 84.6  78.0 - 100.0 fL   MCH 27.4  26.0 - 34.0 pg   MCHC 32.4  30.0 - 36.0 g/dL   RDW 84.6  96.2 - 95.2 %   Platelets 158  150 - 400 K/uL  COMPREHENSIVE METABOLIC PANEL     Status: Abnormal   Collection Time    01/28/13  5:20 AM      Result Value Range   Sodium 140  135 - 145 mEq/L   Potassium 3.9  3.5 - 5.1 mEq/L   Chloride 106  96 - 112 mEq/L   CO2 24  19 - 32 mEq/L   Glucose, Bld 90  70 - 99 mg/dL   BUN 14  6 - 23 mg/dL   Creatinine, Ser 8.41  0.50 - 1.10 mg/dL   Calcium 8.4  8.4 - 32.4 mg/dL   Total Protein 6.0  6.0 - 8.3 g/dL   Albumin 2.7 (*) 3.5 - 5.2 g/dL   AST 18  0 - 37 U/L   ALT 32  0 - 35 U/L   Alkaline Phosphatase 85  39 - 117 U/L   Total Bilirubin 0.2 (*) 0.3 - 1.2 mg/dL   GFR calc non Af Amer >90  >90 mL/min   GFR calc Af Amer >90  >90 mL/min  A: pp preeclampsia  P: Discharge home  signs and symptoms of pre-eclampsia reviewed with patient  RTO in 4 days for bp check

## 2013-01-28 NOTE — Discharge Summary (Signed)
Obstetric Discharge Summary Reason for Admission: observation/evaluation Prenatal Procedures: ultrasound Intrapartum Procedures: spontaneous vaginal delivery Postpartum Procedures: none Complications-Operative and Postpartum: none Hemoglobin  Date Value Range Status  01/28/2013 7.8* 12.0 - 15.0 Stone/dL Final     HCT  Date Value Range Status  01/28/2013 24.1* 36.0 - 46.0 % Final    Physical Exam:  General: alert and cooperative Lochia: appropriate Uterine Fundus: firm Incision: perineum intact DVT Evaluation: No evidence of DVT seen on physical exam. Negative Homan's sign. No cords or calf tenderness. DTR's 1+ no clonus  Discharge Diagnoses: Term Pregnancy-delivered and Preelampsia  Discharge Information: Date: 01/28/2013 Activity: pelvic rest Diet: routine Medications: PNV, Ibuprofen, Percocet and aldomet and Labetalol Condition: stable Instructions: refer to practice specific booklet Discharge to: home   Newborn Data: Live born female  Birth Weight: 6 lb 14.9 oz (3144 Stone) APGAR: 9, 9  Home with mother.  Gabriela Stone 01/28/2013, 9:28 AM

## 2013-12-09 ENCOUNTER — Other Ambulatory Visit: Payer: Self-pay

## 2013-12-09 DIAGNOSIS — Z1231 Encounter for screening mammogram for malignant neoplasm of breast: Secondary | ICD-10-CM

## 2013-12-15 ENCOUNTER — Ambulatory Visit
Admission: RE | Admit: 2013-12-15 | Discharge: 2013-12-15 | Disposition: A | Discharge status: Home / Self Care | Payer: BC Managed Care – PPO | Source: Ambulatory Visit

## 2013-12-15 ENCOUNTER — Ambulatory Visit: Payer: BC Managed Care – PPO

## 2013-12-15 DIAGNOSIS — Z1231 Encounter for screening mammogram for malignant neoplasm of breast: Secondary | ICD-10-CM

## 2014-05-10 ENCOUNTER — Encounter (HOSPITAL_COMMUNITY): Payer: Self-pay | Admitting: *Deleted

## 2014-07-22 ENCOUNTER — Encounter (HOSPITAL_COMMUNITY): Payer: Self-pay | Admitting: Obstetrics and Gynecology

## 2014-12-16 ENCOUNTER — Encounter (HOSPITAL_COMMUNITY): Payer: Self-pay | Admitting: Obstetrics and Gynecology

## 2015-05-24 ENCOUNTER — Other Ambulatory Visit: Payer: Self-pay

## 2015-05-24 DIAGNOSIS — Z1231 Encounter for screening mammogram for malignant neoplasm of breast: Secondary | ICD-10-CM

## 2015-06-13 ENCOUNTER — Ambulatory Visit: Payer: BC Managed Care – PPO

## 2015-09-12 ENCOUNTER — Other Ambulatory Visit: Payer: Self-pay

## 2015-09-12 DIAGNOSIS — Z1231 Encounter for screening mammogram for malignant neoplasm of breast: Secondary | ICD-10-CM

## 2015-10-10 ENCOUNTER — Ambulatory Visit: Payer: BC Managed Care – PPO

## 2015-11-29 ENCOUNTER — Ambulatory Visit
Admission: RE | Admit: 2015-11-29 | Discharge: 2015-11-29 | Disposition: A | Discharge status: Home / Self Care | Payer: BC Managed Care – PPO | Source: Ambulatory Visit

## 2015-11-29 DIAGNOSIS — Z1231 Encounter for screening mammogram for malignant neoplasm of breast: Secondary | ICD-10-CM

## 2017-10-08 DIAGNOSIS — I10 Essential (primary) hypertension: Secondary | ICD-10-CM | POA: Insufficient documentation

## 2017-10-08 DIAGNOSIS — F3342 Major depressive disorder, recurrent, in full remission: Secondary | ICD-10-CM | POA: Insufficient documentation

## 2018-01-29 ENCOUNTER — Other Ambulatory Visit: Payer: Self-pay | Admitting: Obstetrics and Gynecology

## 2018-01-29 DIAGNOSIS — Z1231 Encounter for screening mammogram for malignant neoplasm of breast: Secondary | ICD-10-CM

## 2018-02-19 ENCOUNTER — Ambulatory Visit
Admission: RE | Admit: 2018-02-19 | Discharge: 2018-02-19 | Disposition: A | Discharge status: Home / Self Care | Payer: BC Managed Care – PPO | Source: Ambulatory Visit | Attending: Obstetrics and Gynecology | Admitting: Obstetrics and Gynecology

## 2018-02-19 DIAGNOSIS — Z1231 Encounter for screening mammogram for malignant neoplasm of breast: Secondary | ICD-10-CM

## 2018-02-21 ENCOUNTER — Other Ambulatory Visit: Payer: Self-pay | Admitting: Obstetrics and Gynecology

## 2018-02-21 DIAGNOSIS — Z803 Family history of malignant neoplasm of breast: Secondary | ICD-10-CM

## 2018-03-20 ENCOUNTER — Ambulatory Visit
Admission: RE | Admit: 2018-03-20 | Discharge: 2018-03-20 | Disposition: A | Discharge status: Home / Self Care | Payer: BC Managed Care – PPO | Source: Ambulatory Visit | Attending: Obstetrics and Gynecology | Admitting: Obstetrics and Gynecology

## 2018-03-20 ENCOUNTER — Encounter: Payer: Self-pay | Admitting: Radiology

## 2018-03-20 DIAGNOSIS — Z803 Family history of malignant neoplasm of breast: Secondary | ICD-10-CM

## 2018-03-20 MED ORDER — GADOBENATE DIMEGLUMINE 529 MG/ML IV SOLN
20.0000 mL | Freq: Once | INTRAVENOUS | Status: AC | PRN
  Administered 2018-03-20: 20 mL via INTRAVENOUS

## 2018-04-27 NOTE — Progress Notes (Signed)
Gabriela Stone is a 48 y.o. female is here to Gabriela Stone.   Patient Care Team: Helane Rima, DO as PCP - General (Family Medicine)   History of Present Illness:   HPI:   1. Essential hypertension.   Review: taking medications as instructed, no medication side effects noted, no TIAs, no chest pain on exertion, no dyspnea on exertion, no swelling of ankles.  Smoker: No.  Wt Readings from Last 3 Encounters:  04/28/18 267 lb (121.1 kg)  01/27/13 256 lb 8 oz (116.3 kg)  01/21/13 264 lb 8 oz (120 kg)   BP Readings from Last 3 Encounters:  04/28/18 114/82  01/28/13 136/84  01/23/13 (!) 175/94    2. Recurrent major depressive disorder, in full remission (HCC). Compliant with medication and it has helped her short fuse. She still has crying spells. 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    3. Morbid obesity (HCC). Usual weight is 250. Wants to be under 200 pounds. Previously used [place] with great results.    4. Primary insomnia. Easily falls asleep but cannot stay asleep. Mind races.    Health Maintenance Due  Topic Date Due  . PAP SMEAR  07/04/1991  . INFLUENZA VACCINE  02/06/2018   Depression screen PHQ 2/9 04/28/2018  Decreased Interest 0  Down, Depressed, Hopeless 0  PHQ - 2 Score 0  Altered sleeping 1  Tired, decreased energy 1  Change in appetite 0  Feeling bad or failure about yourself  0  Trouble concentrating 0  Moving slowly or fidgety/restless 0  Suicidal thoughts 0  PHQ-9 Score 2    PMHx, SurgHx, SocialHx, Medications, and Allergies were reviewed in the Visit Navigator and updated as appropriate.   Past Medical History:  Diagnosis Date  . Anxiety   . Depression   . Headache(784.0)   . Herpes   . History of miscarriage    X 4  . Hypertension   . Kidney stones      Past Surgical History:  Procedure Laterality Date  . DILATION AND CURETTAGE OF UTERUS    . HERNIA REPAIR    . INCISION AND  DRAINAGE PERIRECTAL ABSCESS    . TONSILLECTOMY       Family History  Problem Relation Age of Onset  . Hypertension Mother   . Hypertension Brother     Social History   Tobacco Use  . Smoking status: Never Smoker  . Smokeless tobacco: Never Used  Substance Use Topics  . Alcohol use: No  . Drug use: No    Current Medications and Allergies:   .  citalopram (CELEXA) 40 MG tablet, Take by mouth., Disp: , Rfl:  .  ferrous sulfate 325 (65 FE) MG tablet, Take 1 tablet (325 mg total) by mouth 2 (two) times daily with a meal., Disp: 90 tablet, Rfl: 3 .  lisinopril-hydrochlorothiazide (PRINZIDE,ZESTORETIC) 20-25 MG tablet, Take by mouth., Disp: , Rfl:  .  metoprolol succinate (TOPROL-XL) 100 MG 24 hr tablet, Take by mouth., Disp: , Rfl:  .  traZODone (DESYREL) 50 MG tablet, Take 0.5-1 tablets (25-50 mg total) by mouth at bedtime as needed for sleep., Disp: 30 tablet, Rfl: 0  No Known Allergies   Review of Systems:   Pertinent items are noted in the HPI. Otherwise, ROS is negative.  Vitals:   Vitals:   04/28/18 0706  BP: 114/82  Pulse: 68  Temp: 98.2 F (36.8 C)  TempSrc: Oral  SpO2: 98%  Weight: 267 lb (121.1 kg)  Height: 5\' 5"  (1.651 m)     Body mass index is 44.43 kg/m.  Physical Exam:   Physical Exam  Constitutional: She appears well-nourished.  HENT:  Head: Normocephalic and atraumatic.  Eyes: Pupils are equal, round, and reactive to light. EOM are normal.  Neck: Normal range of motion. Neck supple.  Cardiovascular: Normal rate, regular rhythm, normal heart sounds and intact distal pulses.  Pulmonary/Chest: Effort normal.  Abdominal: Soft.  Skin: Skin is warm.  Psychiatric: She has a normal mood and affect. Her behavior is normal.  Nursing note and vitals reviewed.  Assessment and Plan:   Gabriela Stone was seen today for establish care.  Diagnoses and all orders for this visit:  Essential hypertension  Recurrent major depressive disorder, in full remission  (HCC)  Fatigue, unspecified type -     CBC with Differential/Platelet -     Comprehensive metabolic panel -     TSH -     Hemoglobin A1c  Morbid obesity (HCC) -     CBC with Differential/Platelet -     Comprehensive metabolic panel -     TSH -     Hemoglobin A1c -     Liraglutide -Weight Management (SAXENDA) 18 MG/3ML SOPN; Inject 0.6 mg into the skin 2 (two) times daily as needed. -     Liraglutide -Weight Management (SAXENDA) 18 MG/3ML SOPN; Inject 1.8 mg into the skin daily. -     Insulin Pen Needle (PEN NEEDLES) 31G X 6 MM MISC; Needles for the Saxenda  Primary insomnia -     traZODone (DESYREL) 50 MG tablet; Take 0.5-1 tablets (25-50 mg total) by mouth at bedtime as needed for sleep.  Gastroesophageal reflux disease without esophagitis -     omeprazole (PRILOSEC) 20 MG capsule; Take 1 capsule (20 mg total) by mouth daily.   . Reviewed expectations re: course of current medical issues. . Discussed self-management of symptoms. . Outlined signs and symptoms indicating need for more acute intervention. . Patient verbalized understanding and all questions were answered. Marland Kitchen Health Maintenance issues including appropriate healthy diet, exercise, and smoking avoidance were discussed with patient. . See orders for this visit as documented in the electronic medical record. . Patient received an After Visit Summary.   Helane Rima, DO [place] 04/28/2018

## 2018-04-28 ENCOUNTER — Encounter: Payer: Self-pay | Admitting: Family Medicine

## 2018-04-28 ENCOUNTER — Ambulatory Visit: Payer: BC Managed Care – PPO | Admitting: Family Medicine

## 2018-04-28 VITALS — BP 114/82 | HR 68 | Temp 98.2°F | Ht 65.0 in | Wt 267.0 lb

## 2018-04-28 DIAGNOSIS — F3342 Major depressive disorder, recurrent, in full remission: Secondary | ICD-10-CM

## 2018-04-28 DIAGNOSIS — I10 Essential (primary) hypertension: Secondary | ICD-10-CM

## 2018-04-28 DIAGNOSIS — F5101 Primary insomnia: Secondary | ICD-10-CM

## 2018-04-28 DIAGNOSIS — R5383 Other fatigue: Secondary | ICD-10-CM

## 2018-04-28 DIAGNOSIS — K219 Gastro-esophageal reflux disease without esophagitis: Secondary | ICD-10-CM

## 2018-04-28 LAB — CBC WITH DIFFERENTIAL/PLATELET
Basophils Absolute: 0 10*3/uL (ref 0.0–0.1)
Basophils Relative: 0.2 % (ref 0.0–3.0)
Eosinophils Absolute: 0.2 10*3/uL (ref 0.0–0.7)
Eosinophils Relative: 5.3 % — ABNORMAL HIGH (ref 0.0–5.0)
HCT: 35 % — ABNORMAL LOW (ref 36.0–46.0)
Hemoglobin: 11.5 g/dL — ABNORMAL LOW (ref 12.0–15.0)
Lymphocytes Relative: 28.9 % (ref 12.0–46.0)
Lymphs Abs: 1.2 10*3/uL (ref 0.7–4.0)
MCHC: 32.9 g/dL (ref 30.0–36.0)
MCV: 80.3 fl (ref 78.0–100.0)
Monocytes Absolute: 0.4 10*3/uL (ref 0.1–1.0)
Monocytes Relative: 9.4 % (ref 3.0–12.0)
Neutro Abs: 2.4 10*3/uL (ref 1.4–7.7)
Neutrophils Relative %: 56.2 % (ref 43.0–77.0)
Platelets: 128 10*3/uL — ABNORMAL LOW (ref 150.0–400.0)
RBC: 4.36 Mil/uL (ref 3.87–5.11)
RDW: 14.8 % (ref 11.5–15.5)
WBC: 4.2 10*3/uL (ref 4.0–10.5)

## 2018-04-28 LAB — COMPREHENSIVE METABOLIC PANEL
ALT: 17 U/L (ref 0–35)
AST: 14 U/L (ref 0–37)
Albumin: 3.9 g/dL (ref 3.5–5.2)
Alkaline Phosphatase: 69 U/L (ref 39–117)
BUN: 18 mg/dL (ref 6–23)
CO2: 28 mEq/L (ref 19–32)
Calcium: 9 mg/dL (ref 8.4–10.5)
Chloride: 105 mEq/L (ref 96–112)
Creatinine, Ser: 0.93 mg/dL (ref 0.40–1.20)
GFR: 68.44 mL/min (ref >60.00)
Glucose, Bld: 108 mg/dL — ABNORMAL HIGH (ref 70–99)
Potassium: 3.8 mEq/L (ref 3.5–5.1)
Sodium: 140 mEq/L (ref 135–145)
Total Bilirubin: 0.4 mg/dL (ref 0.2–1.2)
Total Protein: 6.8 g/dL (ref 6.0–8.3)

## 2018-04-28 LAB — TSH: TSH: 1.44 u[IU]/mL (ref 0.35–4.50)

## 2018-04-28 LAB — HEMOGLOBIN A1C: Hgb A1c MFr Bld: 5.8 % (ref 4.6–6.5)

## 2018-04-28 MED ORDER — PEN NEEDLES 31G X 6 MM MISC
0 refills | Status: DC
Start: 2018-04-28 — End: 2018-05-26

## 2018-04-28 MED ORDER — TRAZODONE HCL 50 MG PO TABS: 25.0000 mg | ORAL_TABLET | Freq: Every evening | ORAL | 0 refills | Status: DC | PRN

## 2018-04-28 MED ORDER — LIRAGLUTIDE -WEIGHT MANAGEMENT 18 MG/3ML ~~LOC~~ SOPN: 1.8000 mg | PEN_INJECTOR | Freq: Every day | SUBCUTANEOUS | 3 refills | Status: DC

## 2018-04-28 MED ORDER — LIRAGLUTIDE -WEIGHT MANAGEMENT 18 MG/3ML ~~LOC~~ SOPN: 0.6000 mg | PEN_INJECTOR | Freq: Two times a day (BID) | SUBCUTANEOUS | 0 refills | Status: DC | PRN

## 2018-04-28 MED ORDER — OMEPRAZOLE 20 MG PO CPDR: 20.0000 mg | DELAYED_RELEASE_CAPSULE | Freq: Every day | ORAL | 3 refills | Status: DC

## 2018-05-02 ENCOUNTER — Telehealth: Payer: Self-pay

## 2018-05-02 NOTE — Telephone Encounter (Signed)
Saxenda 18mg /35mL  BIN [id number] PCN ADV RxGRP [id number]  PA initiated via [email or web address]

## 2018-05-02 NOTE — Telephone Encounter (Addendum)
Need to speak with pt regarding questions for PA -  Has the patient completed at least 16 weeks of therapy with the requested drug?  Did the patient lose at least 4 percent of baseline body weight OR has the patient continued to maintain their initial 4 percent weight loss?  Called pt and left VM to call the office.   Indication for Saxenda   Bernie Covey  is a glucagon-like peptide-1 (GLP-1) receptor agonist indicated as an adjunct to a reduced-calorie diet and increased physical activity for chronic weight management in adult patients with an initial body mass index (BMI) of:  30 kg/m2 or greater (obese) or  27 kg/m2 or greater (overweight) in the presence of at least one weight-related comorbid condition (e.g. hypertension, type 2 diabetes mellitus, or dyslipidemia)  Commonly used ICD-10 diagnosis codes include:  E66 - Overweight and obesity  Z68 - Body mass index [BMI]  For full Prescribing Information, click here.  For Important Safety Information, click here. Financial Information  The [employer] Card can help patients save on their prescription. Patients can pay as little as $25 or save up to $200 per [facility] prescription.  To learn more, click here. Have your patients sign up for personalized support through Sax[facility]. Click here for more information. Please indicate the patient's previous medication history in the free text field above (if available) for any of the following medications: Phentermine (Adipex-P)  Benzphetamine (Didrex)  Diethylpropion (Tenuate)  Orlistat (Alli; Xenical)  Bupropion-naltrexone (Contrave)  Lorcaserin (Belviq)  Phentermine-topiramate (Qysmia)  Other (please specify) Please indicate in the free text field above (if available) is this request is for initiation or continuation of therapy. Saxenda  comes in packs of five 3 mL pens (15 mL total). Quantity is typically:  15 mL (1 box) per 30 days  45 mL (3 boxes) per 90 days

## 2018-05-24 ENCOUNTER — Other Ambulatory Visit: Payer: Self-pay | Admitting: Family Medicine

## 2018-05-24 DIAGNOSIS — F5101 Primary insomnia: Secondary | ICD-10-CM

## 2018-05-25 DIAGNOSIS — E88819 Insulin resistance, unspecified: Secondary | ICD-10-CM | POA: Insufficient documentation

## 2018-05-25 DIAGNOSIS — D509 Iron deficiency anemia, unspecified: Secondary | ICD-10-CM | POA: Insufficient documentation

## 2018-05-25 DIAGNOSIS — E8881 Metabolic syndrome: Secondary | ICD-10-CM | POA: Insufficient documentation

## 2018-05-25 NOTE — Progress Notes (Signed)
Gabriela Stone is a 46 y.o. female is here for follow up.  History of Present Illness:   HPI:   Essential hypertension Review: taking medications as instructed, no medication side effects noted, no TIAs, no chest pain on exertion, no dyspnea on exertion, no swelling of ankles. Smoker: No  BP Readings from Last 3 Encounters:  05/26/18 122/80  04/28/18 114/82  01/28/13 136/84   Recurrent major depressive disorder, in full remission (HCC)   Office Visit from 05/26/2018 in [facility]  PHQ-9 Total Score  3     Fatigue, unspecified type Labs done at last visit and reviewed with patient today.  Morbid obesity (HCC) Patient was started on SAXENDA and instructed on how to titrate up to the 1.8mg  daily. She was not able to increase. She has stayed at the lower dose daily. She has continued to have loose stools and nasua. She has continued to take daily. She has been continuing to work on portion control and calorie intake. She is working on increasing daily activity.   Primary insomnia Patient on  traZODone 50 MG tablet; Take 0.5-1 tablets  at bedtime as needed for sleep. She has only had to take once after starting. She did admit that it helped a lot she will continue to take as needed.   Gastroesophageal reflux disease without esophagitis Rx omeprazole (PRILOSEC) 20 MG capsule; Take 1 capsule (20 mg total) by mouth daily. She is taking daily and has noticed a big improvement. She did miss a dose and could tell a big difference.   Health Maintenance Due  Topic Date Due  . PAP SMEAR  07/04/1991  . INFLUENZA VACCINE  02/06/2018   Depression screen [facility] 2/9 05/26/2018 04/28/2018  Decreased Interest 0 0  Down, Depressed, Hopeless 0 0  PHQ - 2 Score 0 0  Altered sleeping 0 1  Tired, decreased energy 1 1  Change in appetite 2 0  Feeling bad or failure about yourself  0 0  Trouble concentrating 0 0  Moving slowly or fidgety/restless 0 0  Suicidal thoughts  0 0  PHQ-9 Score 3 2  Difficult doing work/chores Not difficult at all -   PMHx, SurgHx, SocialHx, FamHx, Medications, and Allergies were reviewed in the Visit Navigator and updated as appropriate.   Patient Active Problem List   Diagnosis Date Noted  . Iron deficiency anemia 05/25/2018  . Insulin resistance 05/25/2018  . Primary insomnia 04/28/2018  . Morbid obesity (HCC) 04/28/2018  . Essential hypertension 10/08/2017  . Recurrent major depressive disorder, in full remission (HCC) 10/08/2017   Social History   Tobacco Use  . Smoking status: Never Smoker  . Smokeless tobacco: Never Used  Substance Use Topics  . Alcohol use: No  . Drug use: No   Current Medications and Allergies:   .  citalopram (CELEXA) 40 MG tablet, Take by mouth., Disp: , Rfl:  .  Insulin Pen Needle (PEN NEEDLES) 31G X 6 MM MISC, Needles for the Saxenda, Disp: 50 each, Rfl: 0 .  Liraglutide -Weight Management (SAXENDA) 18 MG/3ML SOPN, Inject 1.8 mg into the skin daily. (Patient taking differently: Inject 0.6 mg into the skin daily. ), Disp: 3 mL, Rfl: 3 .  lisinopril-hydrochlorothiazide (PRINZIDE,ZESTORETIC) 20-25 MG tablet, Take by mouth., Disp: , Rfl:  .  metoprolol succinate (TOPROL-XL) 100 MG 24 hr tablet, Take by mouth., Disp: , Rfl:  .  omeprazole (PRILOSEC) 20 MG capsule, Take 1 capsule (20 mg total) by mouth daily., Disp:  30 capsule, Rfl: 3 .  traZODone (DESYREL) 50 MG tablet, Take 0.5-1 tablets (25-50 mg total) by mouth at bedtime as needed for sleep., Disp: 30 tablet, Rfl: 0  No Known Allergies   Review of Systems   Pertinent items are noted in the HPI. Otherwise, ROS is negative.  Vitals:   Vitals:   05/26/18 0932  BP: 122/80  Pulse: 78  Temp: 98.6 F (37 C)  TempSrc: Oral  SpO2: 96%  Weight: 277 lb (125.6 kg)  Height: 5\' 5"  (1.651 m)     Body mass index is 46.1 kg/m.  Physical Exam:   Physical Exam  Constitutional: She is oriented to person, place, and time. She appears  well-developed and well-nourished. No distress.  HENT:  Head: Normocephalic and atraumatic.  Right Ear: External ear normal.  Left Ear: External ear normal.  Nose: Nose normal.  Mouth/Throat: Oropharynx is clear and moist.  Eyes: Pupils are equal, round, and reactive to light. Conjunctivae and EOM are normal.  Neck: Normal range of motion. Neck supple. No thyromegaly present.  Cardiovascular: Normal rate, regular rhythm, normal heart sounds and intact distal pulses.  Pulmonary/Chest: Effort normal and breath sounds normal.  Abdominal: Soft. Bowel sounds are normal.  Musculoskeletal: Normal range of motion.  Lymphadenopathy:    She has no cervical adenopathy.  Neurological: She is alert and oriented to person, place, and time.  Skin: Skin is warm and dry. Capillary refill takes less than 2 seconds.  Psychiatric: She has a normal mood and affect. Her behavior is normal.  Nursing note and vitals reviewed.   Results for orders placed or performed in visit on 04/28/18  CBC with Differential/Platelet  Result Value Ref Range   WBC 4.2 4.0 - 10.5 K/uL   RBC 4.36 3.87 - 5.11 Mil/uL   Hemoglobin 11.5 (L) 12.0 - 15.0 g/dL   HCT 09.835.0 (L) 11.936.0 - 14.746.0 %   MCV 80.3 78.0 - 100.0 fl   MCHC 32.9 30.0 - 36.0 g/dL   RDW 82.914.8 56.211.5 - 13.015.5 %   Platelets 128.0 (L) 150.0 - 400.0 K/uL   Neutrophils Relative % 56.2 43.0 - 77.0 %   Lymphocytes Relative 28.9 12.0 - 46.0 %   Monocytes Relative 9.4 3.0 - 12.0 %   Eosinophils Relative 5.3 (H) 0.0 - 5.0 %   Basophils Relative 0.2 0.0 - 3.0 %   Neutro Abs 2.4 1.4 - 7.7 K/uL   Lymphs Abs 1.2 0.7 - 4.0 K/uL   Monocytes Absolute 0.4 0.1 - 1.0 K/uL   Eosinophils Absolute 0.2 0.0 - 0.7 K/uL   Basophils Absolute 0.0 0.0 - 0.1 K/uL  Comprehensive metabolic panel  Result Value Ref Range   Sodium 140 135 - 145 mEq/L   Potassium 3.8 3.5 - 5.1 mEq/L   Chloride 105 96 - 112 mEq/L   CO2 28 19 - 32 mEq/L   Glucose, Bld 108 (H) 70 - 99 mg/dL   BUN 18 6 - 23 mg/dL    Creatinine, Ser 8.650.93 0.40 - 1.20 mg/dL   Total Bilirubin 0.4 0.2 - 1.2 mg/dL   Alkaline Phosphatase 69 39 - 117 U/L   AST 14 0 - 37 U/L   ALT 17 0 - 35 U/L   Total Protein 6.8 6.0 - 8.3 g/dL   Albumin 3.9 3.5 - 5.2 g/dL   Calcium 9.0 8.4 - 78.410.5 mg/dL   GFR 69.6268.44 >95.28>60.00 mL/min  TSH  Result Value Ref Range   TSH 1.44 0.35 - 4.50 uIU/mL  Hemoglobin A1c  Result Value Ref Range   Hgb A1c MFr Bld 5.8 4.6 - 6.5 %   Assessment and Plan:   Gabriela Stone was seen today for follow-up.  Diagnoses and all orders for this visit:  Essential hypertension Comments: At goal. Continue current treatment.  Morbid obesity (HCC) Comments: Weight increased. Stop Saxenda. Will trial phentermine and metformin. Will stop sweat tea and stop eating after dinner.  Orders: -     phentermine (ADIPEX-P) 37.5 MG tablet; Take 1 tablet (37.5 mg total) by mouth daily before breakfast.  Primary insomnia Comments: Improved with Trazodone. Okay to use nightly.  Orders: -     traZODone (DESYREL) 50 MG tablet; Take 0.5-1 tablets (25-50 mg total) by mouth at bedtime as needed for sleep.  Recurrent major depressive disorder, in full remission (HCC) Comments: Emotional eating. Recommended Food and Feelings workbook.   Iron deficiency anemia, unspecified iron deficiency anemia type -     Iron, TIBC and Ferritin Panel  Insulin resistance Comments: A1c 5.8. Metformin. Taper reviewed. Orders: -     metFORMIN (GLUCOPHAGE XR) 750 MG 24 hr tablet; Take 1 tablet (750 mg total) by mouth 2 (two) times daily.  Gastroesophageal reflux disease without esophagitis Comments: Improved with Omeprazole. Orders: -     omeprazole (PRILOSEC) 20 MG capsule; Take 1 capsule (20 mg total) by mouth daily.  Screening for lipid disorders -     Lipid panel  Need for immunization against influenza -     Flu Vaccine QUAD 36+ mos IM   . Reviewed expectations re: course of current medical issues. . Discussed self-management of  symptoms. . Outlined signs and symptoms indicating need for more acute intervention. . Patient verbalized understanding and all questions were answered. Marland Kitchen Health Maintenance issues including appropriate healthy diet, exercise, and smoking avoidance were discussed with patient. . See orders for this visit as documented in the electronic medical record. . Patient received an After Visit Summary.  Helane Rima, DO [place] 05/26/2018  Future Appointments  Date Time Provider Department Center  06/23/2018  7:40 AM Helane Rima, DO [facility]

## 2018-05-26 ENCOUNTER — Ambulatory Visit: Payer: BC Managed Care – PPO | Admitting: Family Medicine

## 2018-05-26 ENCOUNTER — Encounter: Payer: Self-pay | Admitting: Family Medicine

## 2018-05-26 VITALS — BP 122/80 | HR 78 | Temp 98.6°F | Ht 65.0 in | Wt 277.0 lb

## 2018-05-26 DIAGNOSIS — F3342 Major depressive disorder, recurrent, in full remission: Secondary | ICD-10-CM

## 2018-05-26 DIAGNOSIS — I10 Essential (primary) hypertension: Secondary | ICD-10-CM | POA: Diagnosis not present

## 2018-05-26 DIAGNOSIS — Z23 Encounter for immunization: Secondary | ICD-10-CM | POA: Diagnosis not present

## 2018-05-26 DIAGNOSIS — K219 Gastro-esophageal reflux disease without esophagitis: Secondary | ICD-10-CM

## 2018-05-26 DIAGNOSIS — Z13220 Encounter for screening for lipoid disorders: Secondary | ICD-10-CM | POA: Diagnosis not present

## 2018-05-26 DIAGNOSIS — E88819 Insulin resistance, unspecified: Secondary | ICD-10-CM

## 2018-05-26 DIAGNOSIS — F5101 Primary insomnia: Secondary | ICD-10-CM | POA: Diagnosis not present

## 2018-05-26 DIAGNOSIS — E8881 Metabolic syndrome: Secondary | ICD-10-CM

## 2018-05-26 DIAGNOSIS — D509 Iron deficiency anemia, unspecified: Secondary | ICD-10-CM

## 2018-05-26 LAB — LIPID PANEL
Cholesterol: 174 mg/dL (ref 0–200)
HDL: 40.1 mg/dL (ref >39.00)
LDL Cholesterol: 113 mg/dL — ABNORMAL HIGH (ref 0–99)
NonHDL: 134.24
Total CHOL/HDL Ratio: 4
Triglycerides: 107 mg/dL (ref 0.0–149.0)
VLDL: 21.4 mg/dL (ref 0.0–40.0)

## 2018-05-26 MED ORDER — PHENTERMINE HCL 37.5 MG PO TABS: 37.5000 mg | ORAL_TABLET | Freq: Every day | ORAL | 1 refills | Status: DC

## 2018-05-26 MED ORDER — TRAZODONE HCL 50 MG PO TABS: 25.0000 mg | ORAL_TABLET | Freq: Every evening | ORAL | 0 refills | Status: DC | PRN

## 2018-05-26 MED ORDER — OMEPRAZOLE 20 MG PO CPDR
20.0000 mg | DELAYED_RELEASE_CAPSULE | Freq: Every day | ORAL | 3 refills | Status: AC
Start: 2018-05-26 — End: ?

## 2018-05-26 MED ORDER — METFORMIN HCL ER 750 MG PO TB24: 750.0000 mg | ORAL_TABLET | Freq: Two times a day (BID) | ORAL | 2 refills | Status: DC

## 2018-05-26 NOTE — Patient Instructions (Signed)
..  It takes about 2 weeks for protection to develop after vaccination.  There are many flu viruses, and they are always changing. Each year a new flu vaccine is made to protect against three or four viruses that are likely to cause disease in the upcoming flu season. Even when the vaccine doesn't exactly match these viruses, it may still provide some protection.   Influenza vaccine does not cause flu.  Influenza vaccine may be given at the same time as other vaccines.  3. Talk with your health care provider  Tell your vaccine provider if the person getting the vaccine: ; Has had an allergic reaction after a previous dose of influenza vaccine, or has any severe, life-threatening allergies.  ; Has ever had Guillain-Barr Syndrome (also called GBS).  In some cases, your health care provider may decide to postpone influenza vaccination to a future visit.  People with minor illnesses, such as a cold, may be vaccinated. People who are moderately or severely ill should usually wait until they recover before getting influenza vaccine.  Your health care provider can give you more information.  4. Risks of a reaction  ; Soreness, redness, and swelling where shot is given, fever, muscle aches, and headache can happen after influenza vaccine. ; There may be a very small increased risk of Guillain-Barr Syndrome (GBS) after inactivated influenza vaccine (the flu shot).  Young children who get the flu shot along with pneumococcal vaccine (PCV13), and/or DTaP vaccine at the same time might be slightly more likely to have a seizure caused by fever. Tell your health care provider if a child who is getting flu vaccine has ever had a seizure.  People sometimes faint after medical procedures, including vaccination. Tell your provider if you feel dizzy or have vision changes or ringing in the ears.  As with any medicine, there is a very remote chance of a vaccine causing a severe allergic reaction, other  serious injury, or death.  5. What if there is a serious problem?  An allergic reaction could occur after the vaccinated person leaves the clinic. If you see signs of a severe allergic reaction (hives, swelling of the face and throat, difficulty breathing, a fast heartbeat, dizziness, or weakness), call 9-1-1 and get the person to the nearest hospital.  For other signs that concern you, call your health care provider.  Adverse reactions should be reported to the Vaccine Adverse Event Reporting System (VAERS). Your health care provider will usually file this report, or you can do it yourself. Visit the VAERS website at www.vaers.hhs.gov or call 1-800-822-7967.  VAERS is only for reporting reactions, and VAERS staff do not give medical advice.  6. The National Vaccine Injury Compensation Program  The National Vaccine Injury Compensation Program (VICP) is a federal program that was created to compensate people who may have been injured by certain vaccines. Visit the VICP website at www.hrsa.gov/vaccinecompensation or call 1-800-338-2382 to learn about the program and about filing a claim. There is a time limit to file a claim for compensation.  7. How can I learn more?  ; Ask your health care provider.  ; Call your local or state health department. ; Contact the Centers for Disease Control and Prevention (CDC): - Call 1-800-232-4636 (1-800-CDC-INFO) or - Visit CDC's influenza website at www.cdc.gov/flu  Vaccine Information Statement (Interim) Inactivated Influenza Vaccine  02/20/2018 42 U.S.C.  300aa-26   Department of Health and Human Services Centers for Disease Control and Prevention  Office Use Only  

## 2018-05-27 LAB — IRON,TIBC AND FERRITIN PANEL
%SAT: 25 % (calc) (ref 16–45)
Ferritin: 15 ng/mL — ABNORMAL LOW (ref 16–232)
Iron: 80 ug/dL (ref 40–190)
TIBC: 320 mcg/dL (calc) (ref 250–450)

## 2018-06-01 ENCOUNTER — Encounter: Payer: Self-pay | Admitting: Family Medicine

## 2018-06-01 DIAGNOSIS — E785 Hyperlipidemia, unspecified: Secondary | ICD-10-CM | POA: Insufficient documentation

## 2018-06-22 NOTE — Progress Notes (Deleted)
Gabriela Stone is a 47 y.o. female is here for follow up.  History of Present Illness:   {CMA SCRIBE ATTESTATION}  HPI:    1. Insulin resistance, on Metformin   2. Iron deficiency anemia, unspecified iron deficiency anemia type   3.   4. Primary insomnia    5. Recurrent major depressive disorder, in full remission [facility])    Health Maintenance Due  Topic Date Due  . PAP SMEAR-Modifier  07/04/1991   Depression screen [facility] 2/9 05/26/2018 04/28/2018  Decreased Interest 0 0  Down, Depressed, Hopeless 0 0  PHQ - 2 Score 0 0  Altered sleeping 0 1  Tired, decreased energy 1 1  Change in appetite 2 0  Feeling bad or failure about yourself  0 0  Trouble concentrating 0 0  Moving slowly or fidgety/restless 0 0  Suicidal thoughts 0 0  PHQ-9 Score 3 2  Difficult doing work/chores Not difficult at all -   PMHx, SurgHx, SocialHx, FamHx, Medications, and Allergies were reviewed in the Visit Navigator and updated as appropriate.   Patient Active Problem List   Diagnosis Date Noted  . HLD (hyperlipidemia) 06/01/2018  . Iron deficiency anemia 05/25/2018  . Insulin resistance, on Metformin 05/25/2018  . Primary insomnia 04/28/2018  . Morbid obesity (HCC) 04/28/2018  . Essential hypertension, on Metoprolol, HCTZ, lisinopril 10/08/2017  . Recurrent major depressive disorder, in full remission (HCC) 10/08/2017   Social History   Tobacco Use  . Smoking status: Never Smoker  . Smokeless tobacco: Never Used  Substance Use Topics  . Alcohol use: No  . Drug use: No   Current Medications and Allergies:   .  citalopram (CELEXA) 40 MG tablet, Take by mouth., Disp: , Rfl:  .  lisinopril-hydrochlorothiazide (PRINZIDE,ZESTORETIC) 20-25 MG tablet, Take by mouth., Disp: , Rfl:  .  metFORMIN (GLUCOPHAGE XR) 750 MG 24 hr tablet, Take 1 tablet (750 mg total) by mouth 2 (two) times daily., Disp: 60 tablet, Rfl: 2 .  metoprolol succinate (TOPROL-XL) 100 MG 24 hr tablet, Take by mouth., Disp: ,  Rfl:  .  omeprazole (PRILOSEC) 20 MG capsule, Take 1 capsule (20 mg total) by mouth daily., Disp: 30 capsule, Rfl: 3 .  phentermine (ADIPEX-P) 37.5 MG tablet, Take 1 tablet (37.5 mg total) by mouth daily before breakfast., Disp: 30 tablet, Rfl: 1 .  traZODone (DESYREL) 50 MG tablet, TAKE 0.5-1 TABLETS (25-50 MG TOTAL) BY MOUTH AT BEDTIME AS NEEDED FOR SLEEP., Disp: 90 tablet, Rfl: 1  No Known Allergies   Review of Systems   Pertinent items are noted in the HPI. Otherwise, a complete ROS is negative.  Vitals:  There were no vitals filed for this visit.   There is no height or weight on file to calculate BMI.  Physical Exam:   Physical Exam Vitals signs and nursing note reviewed.  HENT:     Head: Normocephalic and atraumatic.  Eyes:     Pupils: Pupils are equal, round, and reactive to light.  Neck:     Musculoskeletal: Normal range of motion and neck supple.  Cardiovascular:     Rate and Rhythm: Normal rate and regular rhythm.     Heart sounds: Normal heart sounds.  Pulmonary:     Effort: Pulmonary effort is normal.  Abdominal:     Palpations: Abdomen is soft.  Skin:    General: Skin is warm.  Psychiatric:        Behavior: Behavior normal.    Results for orders  placed or performed in visit on 05/26/18  Lipid panel  Result Value Ref Range   Cholesterol 174 0 - 200 mg/dL   Triglycerides 161.0107.0 0.0 - 149.0 mg/dL   HDL 96.0440.10 >54.09>39.00 mg/dL   VLDL 81.121.4 0.0 - 91.440.0 mg/dL   LDL Cholesterol 782113 (H) 0 - 99 mg/dL   Total CHOL/HDL Ratio 4    NonHDL 134.24   Iron, TIBC and Ferritin Panel  Result Value Ref Range   Iron 80 40 - 190 mcg/dL   TIBC 956320 213250 - 086450 mcg/dL (calc)   %SAT 25 16 - 45 % (calc)   Ferritin 15 (L) 16 - 232 ng/mL   Assessment and Plan:   Diagnoses and all orders for this visit:  Insulin resistance, on Metformin  Iron deficiency anemia, unspecified iron deficiency anemia type  Morbid obesity (HCC)  Primary insomnia  Recurrent major depressive disorder,  in full remission (HCC)   . Orders and follow up as documented in EpicCare, reviewed diet, exercise and weight control, cardiovascular risk and specific lipid/LDL goals reviewed, reviewed medications and side effects in detail.  . Reviewed expectations re: course of current medical issues. . Outlined signs and symptoms indicating need for more acute intervention. . Patient verbalized understanding and all questions were answered. . Patient received an After Visit Summary.  *** CMA served as Neurosurgeonscribe during this visit. History, Physical, and Plan performed by medical provider. The above documentation has been reviewed and is accurate and complete. Helane RimaErica Eliberto Sole, D.O.  Helane RimaErica Sholanda Croson, DO [place] 06/22/2018

## 2018-06-23 ENCOUNTER — Ambulatory Visit: Payer: BC Managed Care – PPO | Admitting: Family Medicine

## 2018-06-23 DIAGNOSIS — Z0289 Encounter for other administrative examinations: Secondary | ICD-10-CM

## 2018-06-25 ENCOUNTER — Encounter: Payer: Self-pay | Admitting: Family Medicine

## 2018-07-24 NOTE — Progress Notes (Deleted)
Gabriela Stone is a 48 y.o. female is here for follow up.  History of Present Illness:   {CMA SCRIBE ATTESTATION}  HPI:   Health Maintenance Due  Topic Date Due  . PAP SMEAR-Modifier  07/04/1991   Depression screen [facility] 2/9 05/26/2018 04/28/2018  Decreased Interest 0 0  Down, Depressed, Hopeless 0 0  PHQ - 2 Score 0 0  Altered sleeping 0 1  Tired, decreased energy 1 1  Change in appetite 2 0  Feeling bad or failure about yourself  0 0  Trouble concentrating 0 0  Moving slowly or fidgety/restless 0 0  Suicidal thoughts 0 0  PHQ-9 Score 3 2  Difficult doing work/chores Not difficult at all -   PMHx, SurgHx, SocialHx, FamHx, Medications, and Allergies were reviewed in the Visit Navigator and updated as appropriate.   Patient Active Problem List   Diagnosis Date Noted  . HLD (hyperlipidemia) 06/01/2018  . Iron deficiency anemia 05/25/2018  . Insulin resistance, on Metformin 05/25/2018  . Primary insomnia 04/28/2018  . Morbid obesity (HCC) 04/28/2018  . Essential hypertension, on Metoprolol, HCTZ, lisinopril 10/08/2017  . Recurrent major depressive disorder, in full remission (HCC) 10/08/2017   Social History   Tobacco Use  . Smoking status: Never Smoker  . Smokeless tobacco: Never Used  Substance Use Topics  . Alcohol use: No  . Drug use: No   Current Medications and Allergies:   Current Outpatient Medications:  .  citalopram (CELEXA) 40 MG tablet, Take by mouth., Disp: , Rfl:  .  lisinopril-hydrochlorothiazide (PRINZIDE,ZESTORETIC) 20-25 MG tablet, Take by mouth., Disp: , Rfl:  .  metFORMIN (GLUCOPHAGE XR) 750 MG 24 hr tablet, Take 1 tablet (750 mg total) by mouth 2 (two) times daily., Disp: 60 tablet, Rfl: 2 .  metoprolol succinate (TOPROL-XL) 100 MG 24 hr tablet, Take by mouth., Disp: , Rfl:  .  omeprazole (PRILOSEC) 20 MG capsule, Take 1 capsule (20 mg total) by mouth daily., Disp: 30 capsule, Rfl: 3 .  phentermine (ADIPEX-P) 37.5 MG tablet, Take 1 tablet  (37.5 mg total) by mouth daily before breakfast., Disp: 30 tablet, Rfl: 1 .  traZODone (DESYREL) 50 MG tablet, TAKE 0.5-1 TABLETS (25-50 MG TOTAL) BY MOUTH AT BEDTIME AS NEEDED FOR SLEEP., Disp: 90 tablet, Rfl: 1  No Known Allergies Review of Systems   Pertinent items are noted in the HPI. Otherwise, a complete ROS is negative.  Vitals:  There were no vitals filed for this visit.   There is no height or weight on file to calculate BMI.  Physical Exam:   Physical Exam  Results for orders placed or performed in visit on 05/26/18  Lipid panel  Result Value Ref Range   Cholesterol 174 0 - 200 mg/dL   Triglycerides 893.8 0.0 - 149.0 mg/dL   HDL 10.17 >51.02 mg/dL   VLDL 58.5 0.0 - 27.7 mg/dL   LDL Cholesterol 824 (H) 0 - 99 mg/dL   Total CHOL/HDL Ratio 4    NonHDL 134.24   Iron, TIBC and Ferritin Panel  Result Value Ref Range   Iron 80 40 - 190 mcg/dL   TIBC 235 361 - 443 mcg/dL (calc)   %SAT 25 16 - 45 % (calc)   Ferritin 15 (L) 16 - 232 ng/mL    Assessment and Plan:   There are no diagnoses linked to this encounter.  . Orders and follow up as documented in EpicCare, reviewed diet, exercise and weight control, cardiovascular risk and specific lipid/LDL  goals reviewed, reviewed medications and side effects in detail.  . Reviewed expectations re: course of current medical issues. . Outlined signs and symptoms indicating need for more acute intervention. . Patient verbalized understanding and all questions were answered. . Patient received an After Visit Summary.  *** CMA served as Neurosurgeon during this visit. History, Physical, and Plan performed by medical provider. The above documentation has been reviewed and is accurate and complete. Helane Rima, D.O.  Helane Rima, DO [place] 07/24/2018

## 2018-07-25 ENCOUNTER — Ambulatory Visit: Payer: BC Managed Care – PPO | Admitting: Family Medicine

## 2018-07-29 NOTE — Progress Notes (Deleted)
Gabriela Stone is a 48 y.o. female is here for follow up.  History of Present Illness:   I, *** ,acting as a scribe for Helane Rima, DO, have documented all relevant documentation on the behalf of Helane Rima, DO, as directed by  Helane Rima, DO while in the presence of Helane Rima, DO.  HPI:   Health Maintenance Due  Topic Date Due  . PAP SMEAR-Modifier  07/04/1991   Depression screen [facility] 2/9 05/26/2018 04/28/2018  Decreased Interest 0 0  Down, Depressed, Hopeless 0 0  PHQ - 2 Score 0 0  Altered sleeping 0 1  Tired, decreased energy 1 1  Change in appetite 2 0  Feeling bad or failure about yourself  0 0  Trouble concentrating 0 0  Moving slowly or fidgety/restless 0 0  Suicidal thoughts 0 0  PHQ-9 Score 3 2  Difficult doing work/chores Not difficult at all -   PMHx, SurgHx, SocialHx, FamHx, Medications, and Allergies were reviewed in the Visit Navigator and updated as appropriate.   Patient Active Problem List   Diagnosis Date Noted  . HLD (hyperlipidemia) 06/01/2018  . Iron deficiency anemia 05/25/2018  . Insulin resistance, on Metformin 05/25/2018  . Primary insomnia 04/28/2018  . Morbid obesity (HCC) 04/28/2018  . Essential hypertension, on Metoprolol, HCTZ, lisinopril 10/08/2017  . Recurrent major depressive disorder, in full remission (HCC) 10/08/2017   Social History   Tobacco Use  . Smoking status: Never Smoker  . Smokeless tobacco: Never Used  Substance Use Topics  . Alcohol use: No  . Drug use: No   Current Medications and Allergies:   Current Outpatient Medications:  .  citalopram (CELEXA) 40 MG tablet, Take by mouth., Disp: , Rfl:  .  lisinopril-hydrochlorothiazide (PRINZIDE,ZESTORETIC) 20-25 MG tablet, Take by mouth., Disp: , Rfl:  .  metFORMIN (GLUCOPHAGE XR) 750 MG 24 hr tablet, Take 1 tablet (750 mg total) by mouth 2 (two) times daily., Disp: 60 tablet, Rfl: 2 .  metoprolol succinate (TOPROL-XL) 100 MG 24 hr tablet, Take by mouth., Disp:  , Rfl:  .  omeprazole (PRILOSEC) 20 MG capsule, Take 1 capsule (20 mg total) by mouth daily., Disp: 30 capsule, Rfl: 3 .  phentermine (ADIPEX-P) 37.5 MG tablet, Take 1 tablet (37.5 mg total) by mouth daily before breakfast., Disp: 30 tablet, Rfl: 1 .  traZODone (DESYREL) 50 MG tablet, TAKE 0.5-1 TABLETS (25-50 MG TOTAL) BY MOUTH AT BEDTIME AS NEEDED FOR SLEEP., Disp: 90 tablet, Rfl: 1  No Known Allergies Review of Systems   Pertinent items are noted in the HPI. Otherwise, a complete ROS is negative.  Vitals:  There were no vitals filed for this visit.   There is no height or weight on file to calculate BMI.  Physical Exam:   Physical Exam  Results for orders placed or performed in visit on 05/26/18  Lipid panel  Result Value Ref Range   Cholesterol 174 0 - 200 mg/dL   Triglycerides 619.5 0.0 - 149.0 mg/dL   HDL 09.32 >67.12 mg/dL   VLDL 45.8 0.0 - 09.9 mg/dL   LDL Cholesterol 833 (H) 0 - 99 mg/dL   Total CHOL/HDL Ratio 4    NonHDL 134.24   Iron, TIBC and Ferritin Panel  Result Value Ref Range   Iron 80 40 - 190 mcg/dL   TIBC 825 053 - 976 mcg/dL (calc)   %SAT 25 16 - 45 % (calc)   Ferritin 15 (L) 16 - 232 ng/mL  Assessment and Plan:   There are no diagnoses linked to this encounter.  . Orders and follow up as documented in EpicCare, reviewed diet, exercise and weight control, cardiovascular risk and specific lipid/LDL goals reviewed, reviewed medications and side effects in detail.  . Reviewed expectations re: course of current medical issues. . Outlined signs and symptoms indicating need for more acute intervention. . Patient verbalized understanding and all questions were answered. . Patient received an After Visit Summary.  *** CMA served as Neurosurgeon during this visit. History, Physical, and Plan performed by medical provider. The above documentation has been reviewed and is accurate and complete. Helane Rima, D.O.  Helane Rima, DO [place]  [facility] 07/29/2018

## 2018-07-30 ENCOUNTER — Ambulatory Visit: Payer: BC Managed Care – PPO | Admitting: Family Medicine

## 2018-08-13 ENCOUNTER — Ambulatory Visit: Payer: BC Managed Care – PPO | Admitting: Family Medicine

## 2018-08-13 NOTE — Progress Notes (Deleted)
Maleia Lanie Neuwirth is a 48 y.o. female is here for follow up.  History of Present Illness:   (SCRIBE ATTESTATION)  HPI:   Health Maintenance Due  Topic Date Due  . PAP SMEAR-Modifier  07/04/1991   Depression screen [facility] 2/9 05/26/2018 04/28/2018  Decreased Interest 0 0  Down, Depressed, Hopeless 0 0  PHQ - 2 Score 0 0  Altered sleeping 0 1  Tired, decreased energy 1 1  Change in appetite 2 0  Feeling bad or failure about yourself  0 0  Trouble concentrating 0 0  Moving slowly or fidgety/restless 0 0  Suicidal thoughts 0 0  PHQ-9 Score 3 2  Difficult doing work/chores Not difficult at all -   PMHx, SurgHx, SocialHx, FamHx, Medications, and Allergies were reviewed in the Visit Navigator and updated as appropriate.   Patient Active Problem List   Diagnosis Date Noted  . HLD (hyperlipidemia) 06/01/2018  . Iron deficiency anemia 05/25/2018  . Insulin resistance, on Metformin 05/25/2018  . Primary insomnia 04/28/2018  . Morbid obesity (HCC) 04/28/2018  . Essential hypertension, on Metoprolol, HCTZ, lisinopril 10/08/2017  . Recurrent major depressive disorder, in full remission (HCC) 10/08/2017   Social History   Tobacco Use  . Smoking status: Never Smoker  . Smokeless tobacco: Never Used  Substance Use Topics  . Alcohol use: No  . Drug use: No   Current Medications and Allergies   Current Outpatient Medications:  .  citalopram (CELEXA) 40 MG tablet, Take by mouth., Disp: , Rfl:  .  lisinopril-hydrochlorothiazide (PRINZIDE,ZESTORETIC) 20-25 MG tablet, Take by mouth., Disp: , Rfl:  .  metFORMIN (GLUCOPHAGE XR) 750 MG 24 hr tablet, Take 1 tablet (750 mg total) by mouth 2 (two) times daily., Disp: 60 tablet, Rfl: 2 .  metoprolol succinate (TOPROL-XL) 100 MG 24 hr tablet, Take by mouth., Disp: , Rfl:  .  omeprazole (PRILOSEC) 20 MG capsule, Take 1 capsule (20 mg total) by mouth daily., Disp: 30 capsule, Rfl: 3 .  phentermine (ADIPEX-P) 37.5 MG tablet, Take 1 tablet (37.5 mg  total) by mouth daily before breakfast., Disp: 30 tablet, Rfl: 1 .  traZODone (DESYREL) 50 MG tablet, TAKE 0.5-1 TABLETS (25-50 MG TOTAL) BY MOUTH AT BEDTIME AS NEEDED FOR SLEEP., Disp: 90 tablet, Rfl: 1  No Known Allergies Review of Systems   Pertinent items are noted in the HPI. Otherwise, a complete ROS is negative.  Vitals  There were no vitals filed for this visit.   There is no height or weight on file to calculate BMI.  Physical Exam   Physical Exam  Results for orders placed or performed in visit on 05/26/18  Lipid panel  Result Value Ref Range   Cholesterol 174 0 - 200 mg/dL   Triglycerides 161.0 0.0 - 149.0 mg/dL   HDL 96.04 >54.09 mg/dL   VLDL 81.1 0.0 - 91.4 mg/dL   LDL Cholesterol 782 (H) 0 - 99 mg/dL   Total CHOL/HDL Ratio 4    NonHDL 134.24   Iron, TIBC and Ferritin Panel  Result Value Ref Range   Iron 80 40 - 190 mcg/dL   TIBC 956 213 - 086 mcg/dL (calc)   %SAT 25 16 - 45 % (calc)   Ferritin 15 (L) 16 - 232 ng/mL    Assessment and Plan   There are no diagnoses linked to this encounter.  . Orders and follow up as documented in EpicCare, reviewed diet, exercise and weight control, cardiovascular risk and specific lipid/LDL goals  reviewed, reviewed medications and side effects in detail.  . Reviewed expectations re: course of current medical issues. . Outlined signs and symptoms indicating need for more acute intervention. . Patient verbalized understanding and all questions were answered. . Patient received an After Visit Summary.  *** CMA served as Neurosurgeon during this visit. History, Physical, and Plan performed by medical provider. The above documentation has been reviewed and is accurate and complete. Helane Rima, D.O.  Helane Rima, DO [place] [place] 08/13/2018

## 2018-09-04 NOTE — Progress Notes (Signed)
I acted as a Neurosurgeon for [employer], DO Corky Mull, LPN   Gabriela Stone is a 48 y.o. female is here for follow up.  History of Present Illness:   HPI: See Assessment and Plan section for Problem Based Charting of issues discussed today.   Weight Management Pt here to follow up on weight loss. Since the last visit 3 months ago pt is down 10 lbs. Pt taking phentermine 37.5 mg every other day. Pt also taking Metformin XR 750 mg BID tolerating well. Denies diarrhea.  Health Maintenance Due  Topic Date Due  . PAP SMEAR-Modifier  07/04/1991   Depression screen [facility] 2/9 05/26/2018 04/28/2018  Decreased Interest 0 0  Down, Depressed, Hopeless 0 0  PHQ - 2 Score 0 0  Altered sleeping 0 1  Tired, decreased energy 1 1  Change in appetite 2 0  Feeling bad or failure about yourself  0 0  Trouble concentrating 0 0  Moving slowly or fidgety/restless 0 0  Suicidal thoughts 0 0  PHQ-9 Score 3 2  Difficult doing work/chores Not difficult at all -   PMHx, SurgHx, SocialHx, FamHx, Medications, and Allergies were reviewed in the Visit Navigator and updated as appropriate.   Patient Active Problem List   Diagnosis Date Noted  . HLD (hyperlipidemia) 06/01/2018  . Iron deficiency anemia 05/25/2018  . Insulin resistance, on Metformin 05/25/2018  . Primary insomnia 04/28/2018  . Morbid obesity (HCC) 04/28/2018  . Essential hypertension, on Metoprolol, HCTZ, lisinopril 10/08/2017  . Recurrent major depressive disorder, in full remission (HCC) 10/08/2017   Social History   Tobacco Use  . Smoking status: Never Smoker  . Smokeless tobacco: Never Used  Substance Use Topics  . Alcohol use: No  . Drug use: No   Current Medications and Allergies:   Current Outpatient Medications:  .  citalopram (CELEXA) 40 MG tablet, Take by mouth., Disp: , Rfl:  .  lisinopril-hydrochlorothiazide (PRINZIDE,ZESTORETIC) 20-25 MG tablet, Take 1 tablet by mouth daily., Disp: 90 tablet, Rfl: 3 .  metFORMIN  (GLUCOPHAGE XR) 750 MG 24 hr tablet, Take 1 tablet (750 mg total) by mouth 2 (two) times daily., Disp: 60 tablet, Rfl: 2 .  omeprazole (PRILOSEC) 20 MG capsule, Take 1 capsule (20 mg total) by mouth daily., Disp: 30 capsule, Rfl: 3 .  phentermine (ADIPEX-P) 37.5 MG tablet, Take 1 tablet (37.5 mg total) by mouth daily before breakfast., Disp: 30 tablet, Rfl: 2 .  traZODone (DESYREL) 50 MG tablet, TAKE 0.5-1 TABLETS (25-50 MG TOTAL) BY MOUTH AT BEDTIME AS NEEDED FOR SLEEP., Disp: 90 tablet, Rfl: 1 .  metoprolol succinate (TOPROL-XL) 100 MG 24 hr tablet, Take 1 tablet (100 mg total) by mouth daily., Disp: 90 tablet, Rfl: 1  No Known Allergies Review of Systems   Pertinent items are noted in the HPI. Otherwise, ROS is negative.  Vitals:   Vitals:   09/05/18 0951  BP: 138/90  Pulse: 76  Temp: 98.1 F (36.7 C)  TempSrc: Oral  SpO2: 97%  Weight: 265 lb 6.1 oz (120.4 kg)  Height: 5\' 5"  (1.651 m)     Body mass index is 44.16 kg/m.  Physical Exam:   Physical Exam Vitals signs and nursing note reviewed.  HENT:     Head: Normocephalic and atraumatic.  Eyes:     Pupils: Pupils are equal, round, and reactive to light.  Neck:     Musculoskeletal: Normal range of motion and neck supple.  Cardiovascular:     Rate and  Rhythm: Normal rate and regular rhythm.     Heart sounds: Normal heart sounds.  Pulmonary:     Effort: Pulmonary effort is normal.  Abdominal:     Palpations: Abdomen is soft.  Skin:    General: Skin is warm.  Psychiatric:        Behavior: Behavior normal.     Assessment and Plan:   Iron deficiency anemia Lab Results  Component Value Date   WBC 4.5 09/05/2018   HGB 12.2 09/05/2018   HCT 37.7 09/05/2018   MCV 79.9 09/05/2018   PLT 158.0 09/05/2018   Lab Results  Component Value Date   IRON 86 09/05/2018   TIBC 329 09/05/2018   FERRITIN 16 09/05/2018   Patient unable to tolerate oral iron. Will order iron infusion x 2, 1 week apart.   Insulin  resistance, on Metformin Lab Results  Component Value Date   HGBA1C 5.6 09/05/2018   Improved. Well controlled.  No signs of complications, medication side effects, or red flags.  Continue current regimen.    Essential hypertension, on Metoprolol, HCTZ, lisinopril BP Readings from Last 3 Encounters:  09/05/18 138/90  05/26/18 122/80  04/28/18 114/82   Well controlled.  No signs of complications, medication side effects, or red flags.  Continue current regimen.    Morbid obesity (HCC) Wt Readings from Last 3 Encounters:  09/05/18 265 lb 6.1 oz (120.4 kg)  05/26/18 277 lb (125.6 kg)  04/28/18 267 lb (121.1 kg)   Continue current treatment.   Orders Placed This Encounter  Procedures  . CBC with Differential/Platelet  . Comprehensive metabolic panel  . Hemoglobin A1c  . Iron, TIBC and Ferritin Panel  . Vitamin B12   Meds ordered this encounter  Medications  . phentermine (ADIPEX-P) 37.5 MG tablet    Sig: Take 1 tablet (37.5 mg total) by mouth daily before breakfast.    Dispense:  30 tablet    Refill:  2  . lisinopril-hydrochlorothiazide (PRINZIDE,ZESTORETIC) 20-25 MG tablet    Sig: Take 1 tablet by mouth daily.    Dispense:  90 tablet    Refill:  3  . metFORMIN (GLUCOPHAGE XR) 750 MG 24 hr tablet    Sig: Take 1 tablet (750 mg total) by mouth 2 (two) times daily.    Dispense:  60 tablet    Refill:  2    . Reviewed expectations re: course of current medical issues. . Discussed self-management of symptoms. . Outlined signs and symptoms indicating need for more acute intervention. . Patient verbalized understanding and all questions were answered. Marland Kitchen Health Maintenance issues including appropriate healthy diet, exercise, and smoking avoidance were discussed with patient. . See orders for this visit as documented in the electronic medical record. . Patient received an After Visit Summary.  Helane Rima, DO [place] [place] 09/06/2018  CMA served as Neurosurgeon  during this visit. History, Physical, and Plan performed by medical provider. The above documentation has been reviewed and is accurate and complete. Helane Rima, D.O.

## 2018-09-05 ENCOUNTER — Ambulatory Visit (INDEPENDENT_AMBULATORY_CARE_PROVIDER_SITE_OTHER): Payer: BC Managed Care – PPO | Admitting: Family Medicine

## 2018-09-05 ENCOUNTER — Other Ambulatory Visit: Payer: Self-pay | Admitting: *Deleted

## 2018-09-05 ENCOUNTER — Encounter: Payer: Self-pay | Admitting: Family Medicine

## 2018-09-05 VITALS — BP 138/90 | HR 76 | Temp 98.1°F | Ht 65.0 in | Wt 265.4 lb

## 2018-09-05 DIAGNOSIS — E8881 Metabolic syndrome: Secondary | ICD-10-CM | POA: Diagnosis not present

## 2018-09-05 DIAGNOSIS — Z79899 Other long term (current) drug therapy: Secondary | ICD-10-CM

## 2018-09-05 DIAGNOSIS — D509 Iron deficiency anemia, unspecified: Secondary | ICD-10-CM

## 2018-09-05 DIAGNOSIS — I10 Essential (primary) hypertension: Secondary | ICD-10-CM | POA: Diagnosis not present

## 2018-09-05 DIAGNOSIS — E88819 Insulin resistance, unspecified: Secondary | ICD-10-CM

## 2018-09-05 LAB — COMPREHENSIVE METABOLIC PANEL
ALT: 16 U/L (ref 0–35)
AST: 16 U/L (ref 0–37)
Albumin: 4.3 g/dL (ref 3.5–5.2)
Alkaline Phosphatase: 73 U/L (ref 39–117)
BUN: 14 mg/dL (ref 6–23)
CO2: 29 mEq/L (ref 19–32)
Calcium: 8.9 mg/dL (ref 8.4–10.5)
Chloride: 107 mEq/L (ref 96–112)
Creatinine, Ser: 0.89 mg/dL (ref 0.40–1.20)
GFR: 67.64 mL/min (ref >60.00)
Glucose, Bld: 88 mg/dL (ref 70–99)
Potassium: 4.1 mEq/L (ref 3.5–5.1)
Sodium: 143 mEq/L (ref 135–145)
Total Bilirubin: 0.4 mg/dL (ref 0.2–1.2)
Total Protein: 7 g/dL (ref 6.0–8.3)

## 2018-09-05 LAB — CBC WITH DIFFERENTIAL/PLATELET
Basophils Absolute: 0 10*3/uL (ref 0.0–0.1)
Basophils Relative: 0.7 % (ref 0.0–3.0)
Eosinophils Absolute: 0.2 10*3/uL (ref 0.0–0.7)
Eosinophils Relative: 5.1 % — ABNORMAL HIGH (ref 0.0–5.0)
HCT: 37.7 % (ref 36.0–46.0)
Hemoglobin: 12.2 g/dL (ref 12.0–15.0)
Lymphocytes Relative: 38.1 % (ref 12.0–46.0)
Lymphs Abs: 1.7 10*3/uL (ref 0.7–4.0)
MCHC: 32.4 g/dL (ref 30.0–36.0)
MCV: 79.9 fl (ref 78.0–100.0)
Monocytes Absolute: 0.4 10*3/uL (ref 0.1–1.0)
Monocytes Relative: 7.8 % (ref 3.0–12.0)
Neutro Abs: 2.2 10*3/uL (ref 1.4–7.7)
Neutrophils Relative %: 48.3 % (ref 43.0–77.0)
Platelets: 158 10*3/uL (ref 150.0–400.0)
RBC: 4.72 Mil/uL (ref 3.87–5.11)
RDW: 14.7 % (ref 11.5–15.5)
WBC: 4.5 10*3/uL (ref 4.0–10.5)

## 2018-09-05 LAB — VITAMIN B12: Vitamin B-12: 302 pg/mL (ref 211–911)

## 2018-09-05 LAB — HEMOGLOBIN A1C: Hgb A1c MFr Bld: 5.6 % (ref 4.6–6.5)

## 2018-09-05 MED ORDER — METOPROLOL SUCCINATE ER 100 MG PO TB24: 100.0000 mg | ORAL_TABLET | Freq: Every day | ORAL | 1 refills | Status: DC

## 2018-09-05 MED ORDER — LISINOPRIL-HYDROCHLOROTHIAZIDE 20-25 MG PO TABS: 1.0000 | ORAL_TABLET | Freq: Every day | ORAL | 3 refills | Status: AC

## 2018-09-05 MED ORDER — PHENTERMINE HCL 37.5 MG PO TABS: 37.5000 mg | ORAL_TABLET | Freq: Every day | ORAL | 2 refills | Status: AC

## 2018-09-05 MED ORDER — METFORMIN HCL ER 750 MG PO TB24: 750.0000 mg | ORAL_TABLET | Freq: Two times a day (BID) | ORAL | 2 refills | Status: AC

## 2018-09-06 LAB — IRON,TIBC AND FERRITIN PANEL
%SAT: 26 % (calc) (ref 16–45)
Ferritin: 16 ng/mL (ref 16–232)
Iron: 86 ug/dL (ref 40–190)
TIBC: 329 mcg/dL (calc) (ref 250–450)

## 2018-09-06 NOTE — Assessment & Plan Note (Signed)
Lab Results  Component Value Date   WBC 4.5 09/05/2018   HGB 12.2 09/05/2018   HCT 37.7 09/05/2018   MCV 79.9 09/05/2018   PLT 158.0 09/05/2018   Lab Results  Component Value Date   IRON 86 09/05/2018   TIBC 329 09/05/2018   FERRITIN 16 09/05/2018   Patient unable to tolerate oral iron. Will order iron infusion x 2, 1 week apart.

## 2018-09-06 NOTE — Assessment & Plan Note (Signed)
BP Readings from Last 3 Encounters:  09/05/18 138/90  05/26/18 122/80  04/28/18 114/82   Well controlled.  No signs of complications, medication side effects, or red flags.  Continue current regimen.

## 2018-09-06 NOTE — Assessment & Plan Note (Signed)
Lab Results  Component Value Date   HGBA1C 5.6 09/05/2018   Improved. Well controlled.  No signs of complications, medication side effects, or red flags.  Continue current regimen.

## 2018-09-06 NOTE — Assessment & Plan Note (Signed)
Wt Readings from Last 3 Encounters:  09/05/18 265 lb 6.1 oz (120.4 kg)  05/26/18 277 lb (125.6 kg)  04/28/18 267 lb (121.1 kg)   Continue current treatment.

## 2018-10-22 ENCOUNTER — Ambulatory Visit: Payer: BC Managed Care – PPO | Admitting: Family Medicine

## 2019-03-18 ENCOUNTER — Other Ambulatory Visit: Payer: Self-pay | Admitting: Family Medicine

## 2019-03-18 DIAGNOSIS — I10 Essential (primary) hypertension: Secondary | ICD-10-CM

## 2019-04-28 IMAGING — MR MR BILATERAL BREAST WITHOUT AND WITH CONTRAST
12 series · 48 of 48 positions shown · IV contrast (multihance)
Comparison: Previous exam(s).

CLINICAL DATA: 46-year-old female with strong family history of
breast cancer, including paternal grandmother diagnosed with breast
cancer in her 40s.

LABS:  Creatinine was obtained on site at [HOSPITAL] at [REDACTED] [HOSPITAL].
Results: Creatinine 0.9 mg/dL.
EXAM:
BILATERAL BREAST MRI WITH AND WITHOUT CONTRAST
TECHNIQUE: Multiplanar, multisequence MR images of both breasts were obtained
prior to and following the intravenous administration of 20 ml of
MultiHance.
Three-dimensional MR images were rendered by post-processing the
original MR data using the DynaCAD thin client. The 3D MR images are
interpreted and the findings are included in the complete MRI report
below.

[Series 2: t2_tirm_tra ipat (a-p) · axial · 3.0mm · 0.78mm/px · 1 of 64 slices shown]
[im 1/64]
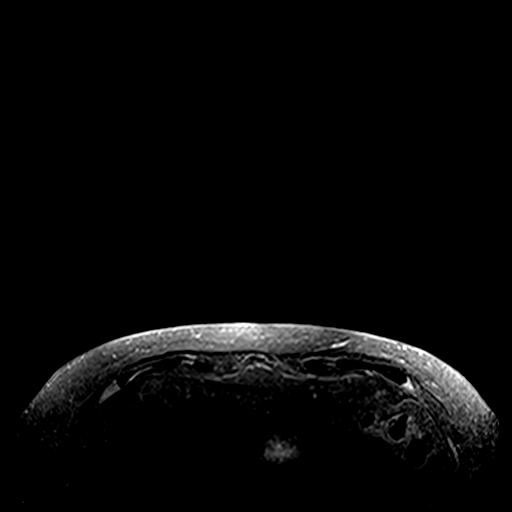

[Series 3: fl3d pre-cm no · axial · non-contrast · 1.2mm · 1.56mm/px · z∈[-103,+107]mm · 5 of 176 slices shown]
[im 1/176]
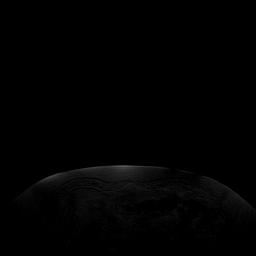
[im 44/176]
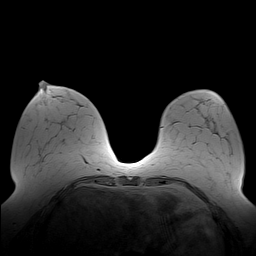
[im 88/176]
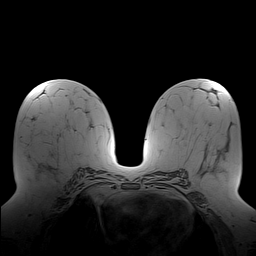
[im 132/176]
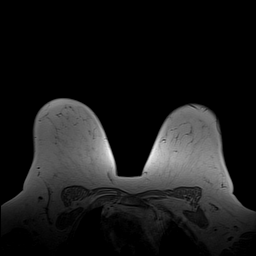
[im 176/176]
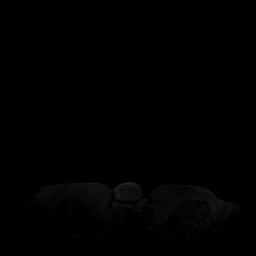

[Series 4: fl3d pre-cm · axial · non-contrast · 1.2mm · 1.56mm/px · z∈[-103,+107]mm · 5 of 176 slices shown]
[im 1/176]
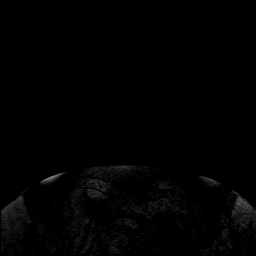
[im 44/176]
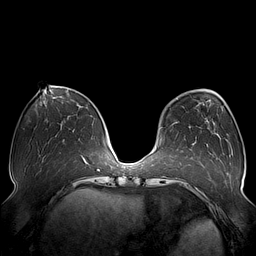
[im 88/176]
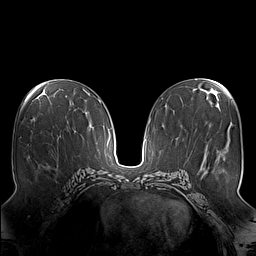
[im 132/176]
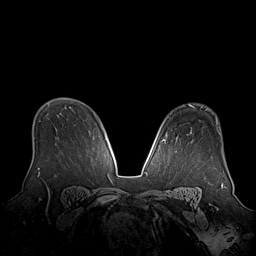
[im 176/176]
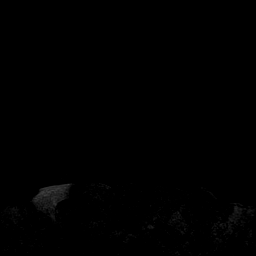

[Series 5: fl3d post-cm 20 · axial · 1.2mm · 1.56mm/px · z∈[-103,+107]mm · 5 of 176 slices shown (1 of 3)]
[im 1/176]
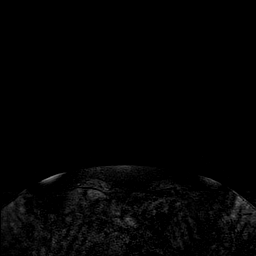
[im 44/176]
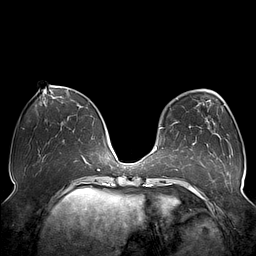
[im 88/176]
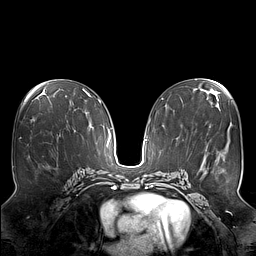
[im 132/176]
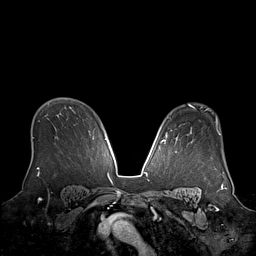
[im 176/176]
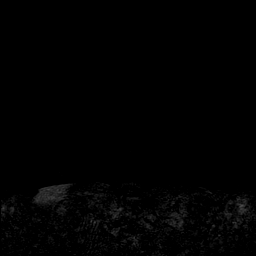

[Series 6: fl3d post-cm 20 · axial · 1.2mm · 1.56mm/px · z∈[-103,+107]mm · 5 of 176 slices shown (2 of 3)]
[im 1/176]
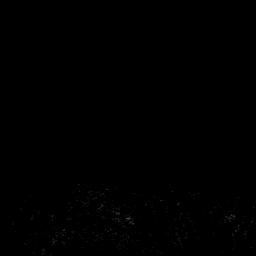
[im 44/176]
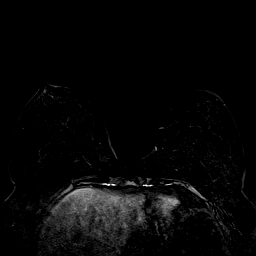
[im 88/176]
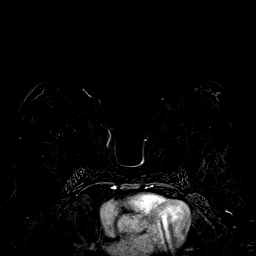
[im 132/176]
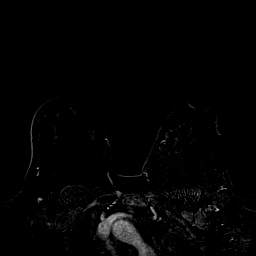
[im 176/176]
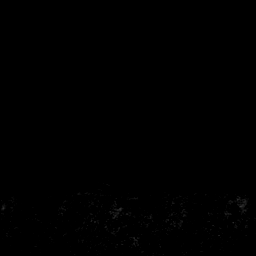

[Series 7: fl3d post-cm 20 · axial · 211.2mm · 1.56mm/px · 1 of 1 slices shown (3 of 3)]
[im 1/1]
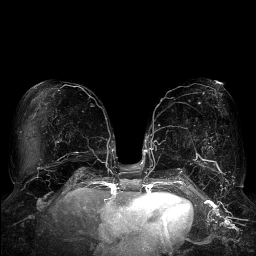

[Series 8: fl3d post-cm 3min · axial · 1.2mm · 1.56mm/px · z∈[-103,+107]mm · 6 of 176 slices shown]
[im 1/176]
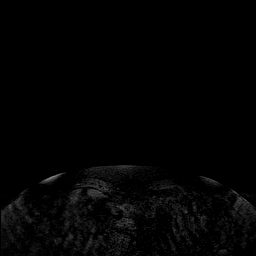
[im 36/176]
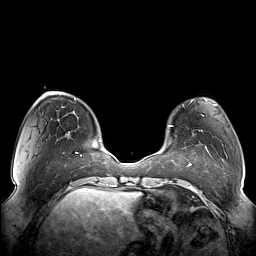
[im 71/176]
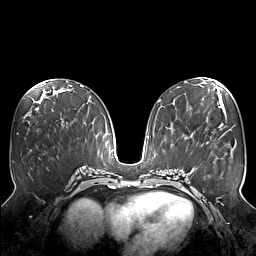
[im 106/176]
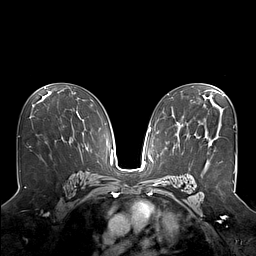
[im 141/176]
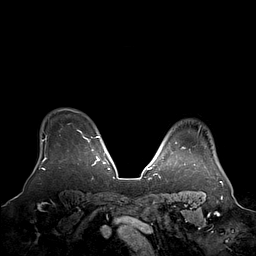
[im 176/176]
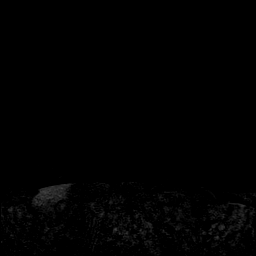

[Series 9: fl3d post-cm 3min_sub · axial · 1.2mm · 1.56mm/px · z∈[-103,+107]mm · 6 of 176 slices shown]
[im 1/176]
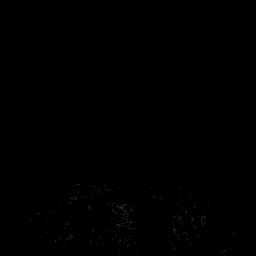
[im 36/176]
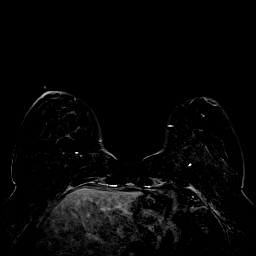
[im 71/176]
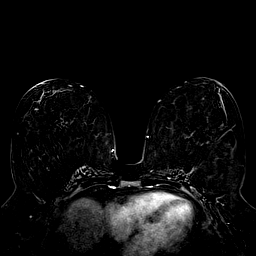
[im 106/176]
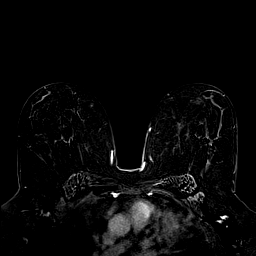
[im 141/176]
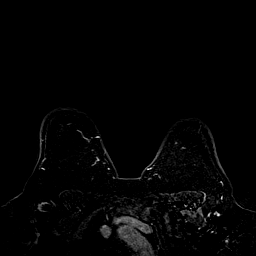
[im 176/176]
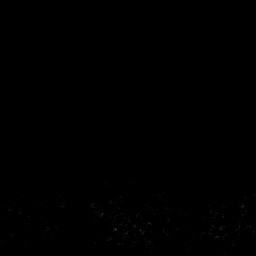

[Series 10: fl3d post-cm 3min_sub_mip_tra · axial · 211.2mm · 1.56mm/px · 1 of 1 slices shown]
[im 1/1]
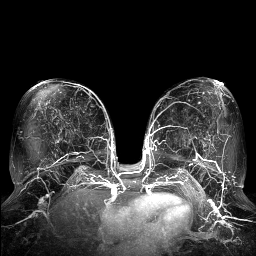

[Series 11: fl3d post-cm 5min · axial · 1.2mm · 1.56mm/px · z∈[-103,+107]mm · 6 of 176 slices shown]
[im 1/176]
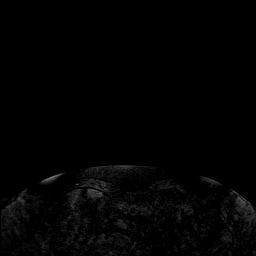
[im 36/176]
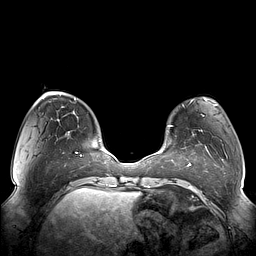
[im 71/176]
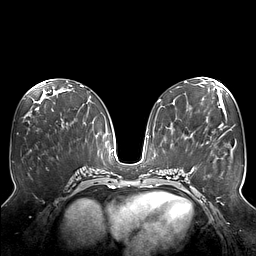
[im 106/176]
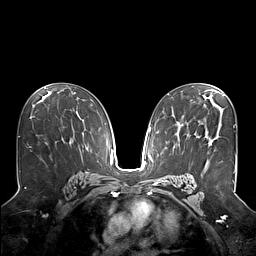
[im 141/176]
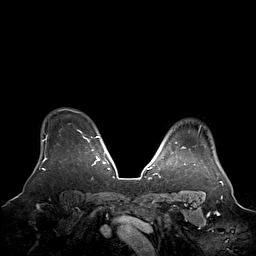
[im 176/176]
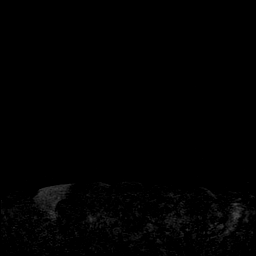

[Series 12: fl3d post-cm 5min_sub · axial · 1.2mm · 1.56mm/px · z∈[-103,+107]mm · 6 of 176 slices shown]
[im 1/176]
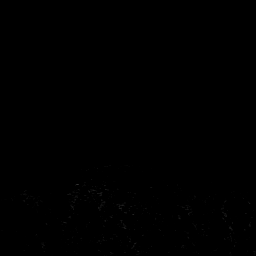
[im 36/176]
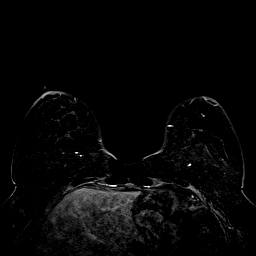
[im 71/176]
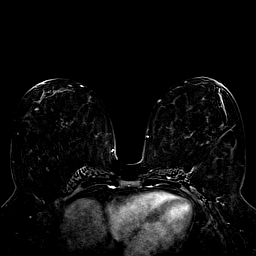
[im 106/176]
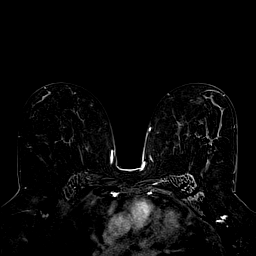
[im 141/176]
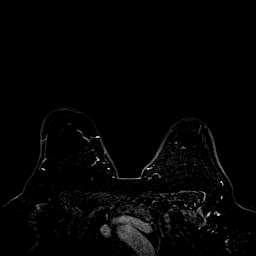
[im 176/176]
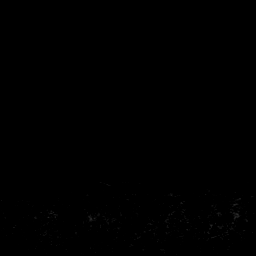

[Series 13: fl3d post-cm 5min_sub_mip_tra · axial · 211.2mm · 1.56mm/px · 1 of 1 slices shown]
[im 1/1]
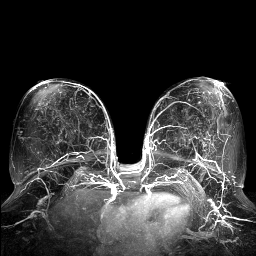

[48 of 48 positions shown; findings below may reference images not displayed]

FINDINGS: Breast composition: b.  Scattered fibroglandular tissue.

Background parenchymal enhancement: Mild, with few scattered
bilateral less than 5 mm enhancing foci.

Right breast: No mass or abnormal enhancement.

Left breast: No mass or abnormal enhancement.

Lymph nodes: No abnormal appearing lymph nodes.

Ancillary findings:  None.
IMPRESSION: No MRI evidence of malignancy in either breast.

RECOMMENDATION:
1.  Recommend annual routine screening mammography, due February 2019.

2. The [employer] Society recommends annual MRI and mammography
in patients with an estimated lifetime risk of developing breast
cancer greater than 20 - 25%, or who are known or suspected to be
positive for the breast cancer gene.

BI-RADS CATEGORY  1: Negative.
